# Patient Record
Sex: Male | Born: 1965 | Race: White | Hispanic: No | Marital: Single | Smoking: Never smoker
Health system: Southern US, Community
[De-identification: ages and names within clinical notes are randomized; demographics above are authoritative.]

## PROBLEM LIST (undated history)

## (undated) DIAGNOSIS — I10 Essential (primary) hypertension: Secondary | ICD-10-CM

## (undated) DIAGNOSIS — I5021 Acute systolic (congestive) heart failure: Secondary | ICD-10-CM

## (undated) DIAGNOSIS — F209 Schizophrenia, unspecified: Secondary | ICD-10-CM

## (undated) DIAGNOSIS — E119 Type 2 diabetes mellitus without complications: Secondary | ICD-10-CM

## (undated) DIAGNOSIS — S99919A Unspecified injury of unspecified ankle, initial encounter: Secondary | ICD-10-CM

## (undated) DIAGNOSIS — I161 Hypertensive emergency: Secondary | ICD-10-CM

## (undated) HISTORY — PX: WISDOM TOOTH EXTRACTION: SHX21

## (undated) HISTORY — PX: VARICOSE VEIN SURGERY: SHX832

---

## 2003-06-09 ENCOUNTER — Inpatient Hospital Stay (HOSPITAL_COMMUNITY): Admission: EM | Admit: 2003-06-09 | Discharge: 2003-06-17 | Payer: Self-pay | Admitting: Psychiatry

## 2003-06-21 ENCOUNTER — Emergency Department (HOSPITAL_COMMUNITY): Admission: EM | Admit: 2003-06-21 | Discharge: 2003-06-22 | Payer: Self-pay | Admitting: Emergency Medicine

## 2007-06-08 ENCOUNTER — Ambulatory Visit: Payer: Self-pay | Admitting: Internal Medicine

## 2008-11-25 ENCOUNTER — Ambulatory Visit: Payer: Self-pay | Admitting: Internal Medicine

## 2009-11-07 ENCOUNTER — Emergency Department (HOSPITAL_COMMUNITY): Admission: EM | Admit: 2009-11-07 | Discharge: 2009-11-07 | Payer: Self-pay | Admitting: Emergency Medicine

## 2011-03-11 NOTE — Discharge Summary (Signed)
NAME:  Kurt Cummings, Kurt Cummings NO.:  [id number]   MEDICAL RECORD NO.:  [id number]                   PATIENT TYPE:  IPS   LOCATION:  0404                                 FACILITY:  [facility]   PHYSICIAN:  Jeanice Lim, M.D.              DATE OF BIRTH:  1966-04-19   DATE OF ADMISSION:  06/09/2003  DATE OF DISCHARGE:  06/17/2003                                 DISCHARGE SUMMARY   IDENTIFYING DATA:  This is a 45 year old single Caucasian  male  involuntarily admitted presenting with a history of  alcohol abuse and  schizophrenia and drinking excessively on a sporadic basis and then  wandering out in the street. He had been arrested and is currently on  probation for indecent exposure. He had moved to [place] 4 months ago and  had legal problems from where he came. His father had been financially  supporting him somewhat and he did fairly well on medications when not  drinking. He denied any depression or acute dangerous ideation.   MEDICATIONS:  None.   ALLERGIES:  No known drug allergies.   PHYSICAL EXAMINATION:  Essentially within normal limits. Neurologically  nonfocal.   LABORATORY DATA:  Routine admission labs, blood sugar slightly elevated at  114. TSH 2.69, within normal limits. Urine drug screen negative.   MENTAL STATUS EXAM:  An alert, young, cooperative male. Good eye contact.  Speech somewhat occasional latency. Mood anxious. Somewhat suspicious or  guarded. Admitting to a history of paranoid ideation, but denied current  delusions. Thought process mostly goal directed. Somewhat overly detailed  and obsessive. Cognitively intact. Judgment and insight fair to poor with a  history of  poor impulse control, minimizing the role of alcohol on his  psychiatric illness.   ADMISSION DIAGNOSES:   AXIS I:  Schizophrenia, paranoid type and alcohol dependence.   AXIS II:  Deferred.   AXIS III:  None.   AXIS IV:  Moderate problems with primary  support group. Also possible  problems with the legal system and living situation.   AXIS V:  35/55.   HOSPITAL COURSE:  The patient was admitted and ordered routine  p.r.n.  medications. He underwent further monitoring. He was encouraged to  participate in individual, group and milieu therapy. The patient was  monitored for safety. He was placed on a low-dose Librium protocol for safe  detox and was started on Risperdal, targeting psychotic symptoms. Risperdal  was discontinued due to the patient's complaint of this causing him to feel  sedated and he was started on Geodon which was adjusted to minimize side-  effects. Ativan  initially which was then discontinued.   The patient worked with a Sports coach on placement options  and  disposition. The patient denied depressive symptoms, therefore Lexapro was  discontinued and Geodon was optimized. The patient reported a positive  response  to medication changes and clinical intervention.   His  condition on discharge was improved. The patient reported motivation to  be compliant with the aftercare plan. He had  no psychotic symptoms or  dangerous ideation at the time of discharge. No withdrawal symptoms and  motivation to remain abstinent and to follow up with the treatment plan.   The risk benefit ratio and alternative treatments regarding medications were  discussed and side-effects were reviewed on more than one occasion. The  patient was comfortable with the medication regimen.   DISCHARGE MEDICATIONS:  Geodon 60 mg q. a.m. and q.6 p.m. with food.   DISCHARGE INSTRUCTIONS:  He was to avoid alcohol  or drug  use. He was to go  to the emergency room if dangerous thoughts or problems with the medication  occurred.   FOLLOW UP:  He was to follow up with [facility] on  June 23, 2003, at 10 a.m.   DISCHARGE DIAGNOSES:   AXIS I:  Schizophrenia, paranoid type and alcohol dependence.   AXIS II:   Deferred.   AXIS III:  None.   AXIS IV:  Moderate problems with primary support group. Also possible  problems with the legal system and living situation.   AXIS VKallie Locks, M.D.    JEM/MEDQ  D:  07/10/2003  T:  07/12/2003  Job:  [id number]

## 2011-03-11 NOTE — H&P (Signed)
NAME:  Kurt Cummings, Kurt Cummings NO.:  [id number]   MEDICAL RECORD NO.:  [id number]                   PATIENT TYPE:  IPS   LOCATION:  0404                                 FACILITY[facility]  [facility]   PHYSICIAN:  Jeanice Lim, M.D.              DATE OF BIRTH:  10/28/1965   DATE OF ADMISSION:  06/09/2003  DATE OF DISCHARGE:                         PSYCHIATRIC ADMISSION ASSESSMENT   IDENTIFYING INFORMATION:  This is a 45 year old single white male  involuntarily admitted on June 09, 2003.   HISTORY OF PRESENT ILLNESS:  The patient presents with a history of alcohol  abuse and history of schizophrenia.  The patient reports he has been  drinking excessively but on a sporadic basis.  He states that, when he does  drink to that extent, he has some inappropriate behavior.  He has been  wandering out in the street.  The patient has been arrested and is currently  on probation for indecent exposure.  He feels that, when he goes to clubs,  he has to spend money on drinks, when he would rather not be drinking.  The  patient reports that he recently moved to [place] about four months ago  for indecent.  He thought he would be better off having a new start in a  different city.  He has been sleeping well overall.  His appetite has been  satisfactory.  When the patient was asked about hearing voices, the patient  needed clarification on what a voice would sound like.  He does denies any  depression, suicidal and homicidal thoughts and knows that he needs some  help with his drinking.   PAST PSYCHIATRIC HISTORY:  First hospitalization to [facility].  Was hospitalized in 1997 in [place], [place] and he states that  he was diagnosed, at the age of 23, with schizophrenia.   SOCIAL HISTORY:  This is a 45 year old single white male with no children.  He recently moved to [place] four months ago.  He is renting a room.  He  reports he cannot return to that room  because of his drinking behavior.  He  is on disability.  He has a court date pending on _________.   FAMILY HISTORY:  The patient denies.   ALCOHOL/DRUG HISTORY:  Nonsmoker.  He has a history of blackouts.  He  normally drinks vodka.  Denies any drug use.   PRIMARY CARE PHYSICIAN:  ________ in [place], [place].   MEDICAL PROBLEMS:  None.   MEDICATIONS:  None.   ALLERGIES:  No known allergies.   PHYSICAL EXAMINATION:  On the chart with no significant findings.  He is in  generally good health without any physical complaints.  The patient has some  bruises to his right arm.   LABORATORY DATA:  Total bilirubin 7.8.  Blood sugar mildly elevated at 114.  TSH is 2.693.  Urine drug screen was negative.   MENTAL STATUS  EXAMINATION:  Alert, young, cooperative male.  Neat with good  eye contact.  Speech with some occasional thought latency.  Mood is anxious.  The patient is anxious and somewhat suspicious.  Thought processes:  The  patient may be responding to internal stimuli due to thought latency and  questioning what hallucinations may be like.  Some positive paranoia.  No  delusions.  No flight of ideas.  Cognitive function intact.  Memory is fair.  Judgment is poor.  Insight is poor.  Poor impulse control.   DIAGNOSES:   AXIS I:  1. Paranoid schizophrenia.  2. Alcohol abuse.   AXIS II:  Deferred.   AXIS III:  None.   AXIS IV:  Problems with primary support group, lack of support, patient  bored and drinks due to boredom, housing, problems related to legal system,  other psychosocial problems.   AXIS V:  Current 35; past year 20.   PLAN:  Voluntary admission for alcohol abuse and inappropriate behavior.  Stabilize mood and thinking so patient can be safe and functional.  Will  check labs.  Will initiate Risperdal for psychosis.  Low-dose Librium to  detox safely.  Will have a family session with father.  The patient is to be  medication-compliant, to remain  alcohol-free.  Caseworker to look at housing  situation.   TENTATIVE LENGTH OF STAY:  Four to six days.     Landry Corporal, N.P.                       Jeanice Lim, M.D.    JO/MEDQ  D:  06/13/2003  T:  06/14/2003  Job:  [id number]

## 2014-09-10 ENCOUNTER — Encounter (HOSPITAL_COMMUNITY): Payer: Self-pay | Admitting: Emergency Medicine

## 2014-09-10 ENCOUNTER — Emergency Department (HOSPITAL_COMMUNITY): Payer: Medicare Other

## 2014-09-10 ENCOUNTER — Inpatient Hospital Stay (HOSPITAL_COMMUNITY)
Admission: EM | Admit: 2014-09-10 | Discharge: 2014-09-16 | DRG: 286 | Disposition: A | Discharge status: Home / Self Care | Payer: Medicare Other | Attending: Internal Medicine | Admitting: Internal Medicine

## 2014-09-10 DIAGNOSIS — E786 Lipoprotein deficiency: Secondary | ICD-10-CM | POA: Diagnosis present

## 2014-09-10 DIAGNOSIS — I2510 Atherosclerotic heart disease of native coronary artery without angina pectoris: Secondary | ICD-10-CM | POA: Diagnosis present

## 2014-09-10 DIAGNOSIS — F203 Undifferentiated schizophrenia: Secondary | ICD-10-CM

## 2014-09-10 DIAGNOSIS — I5041 Acute combined systolic (congestive) and diastolic (congestive) heart failure: Principal | ICD-10-CM | POA: Insufficient documentation

## 2014-09-10 DIAGNOSIS — F209 Schizophrenia, unspecified: Secondary | ICD-10-CM | POA: Diagnosis present

## 2014-09-10 DIAGNOSIS — R0600 Dyspnea, unspecified: Secondary | ICD-10-CM | POA: Diagnosis present

## 2014-09-10 DIAGNOSIS — I5031 Acute diastolic (congestive) heart failure: Secondary | ICD-10-CM

## 2014-09-10 DIAGNOSIS — E1165 Type 2 diabetes mellitus with hyperglycemia: Secondary | ICD-10-CM | POA: Diagnosis present

## 2014-09-10 DIAGNOSIS — R9431 Abnormal electrocardiogram [ECG] [EKG]: Secondary | ICD-10-CM | POA: Diagnosis present

## 2014-09-10 DIAGNOSIS — I169 Hypertensive crisis, unspecified: Secondary | ICD-10-CM | POA: Diagnosis present

## 2014-09-10 DIAGNOSIS — R739 Hyperglycemia, unspecified: Secondary | ICD-10-CM

## 2014-09-10 DIAGNOSIS — E871 Hypo-osmolality and hyponatremia: Secondary | ICD-10-CM | POA: Diagnosis present

## 2014-09-10 DIAGNOSIS — E119 Type 2 diabetes mellitus without complications: Secondary | ICD-10-CM

## 2014-09-10 DIAGNOSIS — D72829 Elevated white blood cell count, unspecified: Secondary | ICD-10-CM | POA: Diagnosis present

## 2014-09-10 DIAGNOSIS — Z794 Long term (current) use of insulin: Secondary | ICD-10-CM | POA: Diagnosis not present

## 2014-09-10 DIAGNOSIS — I160 Hypertensive urgency: Secondary | ICD-10-CM | POA: Insufficient documentation

## 2014-09-10 DIAGNOSIS — I429 Cardiomyopathy, unspecified: Secondary | ICD-10-CM | POA: Diagnosis present

## 2014-09-10 DIAGNOSIS — I161 Hypertensive emergency: Secondary | ICD-10-CM

## 2014-09-10 DIAGNOSIS — Z7982 Long term (current) use of aspirin: Secondary | ICD-10-CM | POA: Diagnosis not present

## 2014-09-10 DIAGNOSIS — R079 Chest pain, unspecified: Secondary | ICD-10-CM | POA: Diagnosis present

## 2014-09-10 DIAGNOSIS — E876 Hypokalemia: Secondary | ICD-10-CM

## 2014-09-10 DIAGNOSIS — J9601 Acute respiratory failure with hypoxia: Secondary | ICD-10-CM | POA: Diagnosis present

## 2014-09-10 DIAGNOSIS — I5021 Acute systolic (congestive) heart failure: Secondary | ICD-10-CM

## 2014-09-10 DIAGNOSIS — I509 Heart failure, unspecified: Secondary | ICD-10-CM

## 2014-09-10 DIAGNOSIS — I10 Essential (primary) hypertension: Secondary | ICD-10-CM | POA: Diagnosis present

## 2014-09-10 DIAGNOSIS — R0602 Shortness of breath: Secondary | ICD-10-CM | POA: Diagnosis present

## 2014-09-10 DIAGNOSIS — I209 Angina pectoris, unspecified: Secondary | ICD-10-CM

## 2014-09-10 HISTORY — DX: Type 2 diabetes mellitus without complications: E11.9

## 2014-09-10 HISTORY — DX: Hypertensive emergency: I16.1

## 2014-09-10 HISTORY — DX: Schizophrenia, unspecified: F20.9

## 2014-09-10 HISTORY — DX: Essential (primary) hypertension: I10

## 2014-09-10 HISTORY — DX: Unspecified injury of unspecified ankle, initial encounter: S99.919A

## 2014-09-10 HISTORY — DX: Acute systolic (congestive) heart failure: I50.21

## 2014-09-10 LAB — BASIC METABOLIC PANEL
ANION GAP: 16 — AB (ref 5–15)
BUN: 16 mg/dL (ref 6–23)
CHLORIDE: 98 meq/L (ref 96–112)
CO2: 20 meq/L (ref 19–32)
Calcium: 9.1 mg/dL (ref 8.4–10.5)
Creatinine, Ser: 0.63 mg/dL (ref 0.50–1.35)
GFR calc Af Amer: >90 mL/min (ref >90)
GFR calc non Af Amer: >90 mL/min (ref >90)
Glucose, Bld: 251 mg/dL — ABNORMAL HIGH (ref 70–99)
Potassium: 3.8 mEq/L (ref 3.7–5.3)
Sodium: 134 mEq/L — ABNORMAL LOW (ref 137–147)

## 2014-09-10 LAB — CBC
HEMATOCRIT: 39 % (ref 39.0–52.0)
HEMOGLOBIN: 14.1 g/dL (ref 13.0–17.0)
MCH: 28 pg (ref 26.0–34.0)
MCHC: 36.2 g/dL — ABNORMAL HIGH (ref 30.0–36.0)
MCV: 77.4 fL — AB (ref 78.0–100.0)
Platelets: 204 10*3/uL (ref 150–400)
RBC: 5.04 MIL/uL (ref 4.22–5.81)
RDW: 13.5 % (ref 11.5–15.5)
WBC: 14.8 10*3/uL — ABNORMAL HIGH (ref 4.0–10.5)

## 2014-09-10 LAB — I-STAT TROPONIN, ED: Troponin i, poc: 0.03 ng/mL (ref 0.00–0.08)

## 2014-09-10 LAB — PRO B NATRIURETIC PEPTIDE: PRO B NATRI PEPTIDE: 957.1 pg/mL — AB (ref 0–125)

## 2014-09-10 IMAGING — CR DG CHEST 2V
2 series · 2 of 2 positions shown · non-contrast
Comparison: None.

CLINICAL DATA: Cough for 1 week. Shortness of breath at night when
lying down.

EXAM:
CHEST  2 VIEW

[w chest pa]
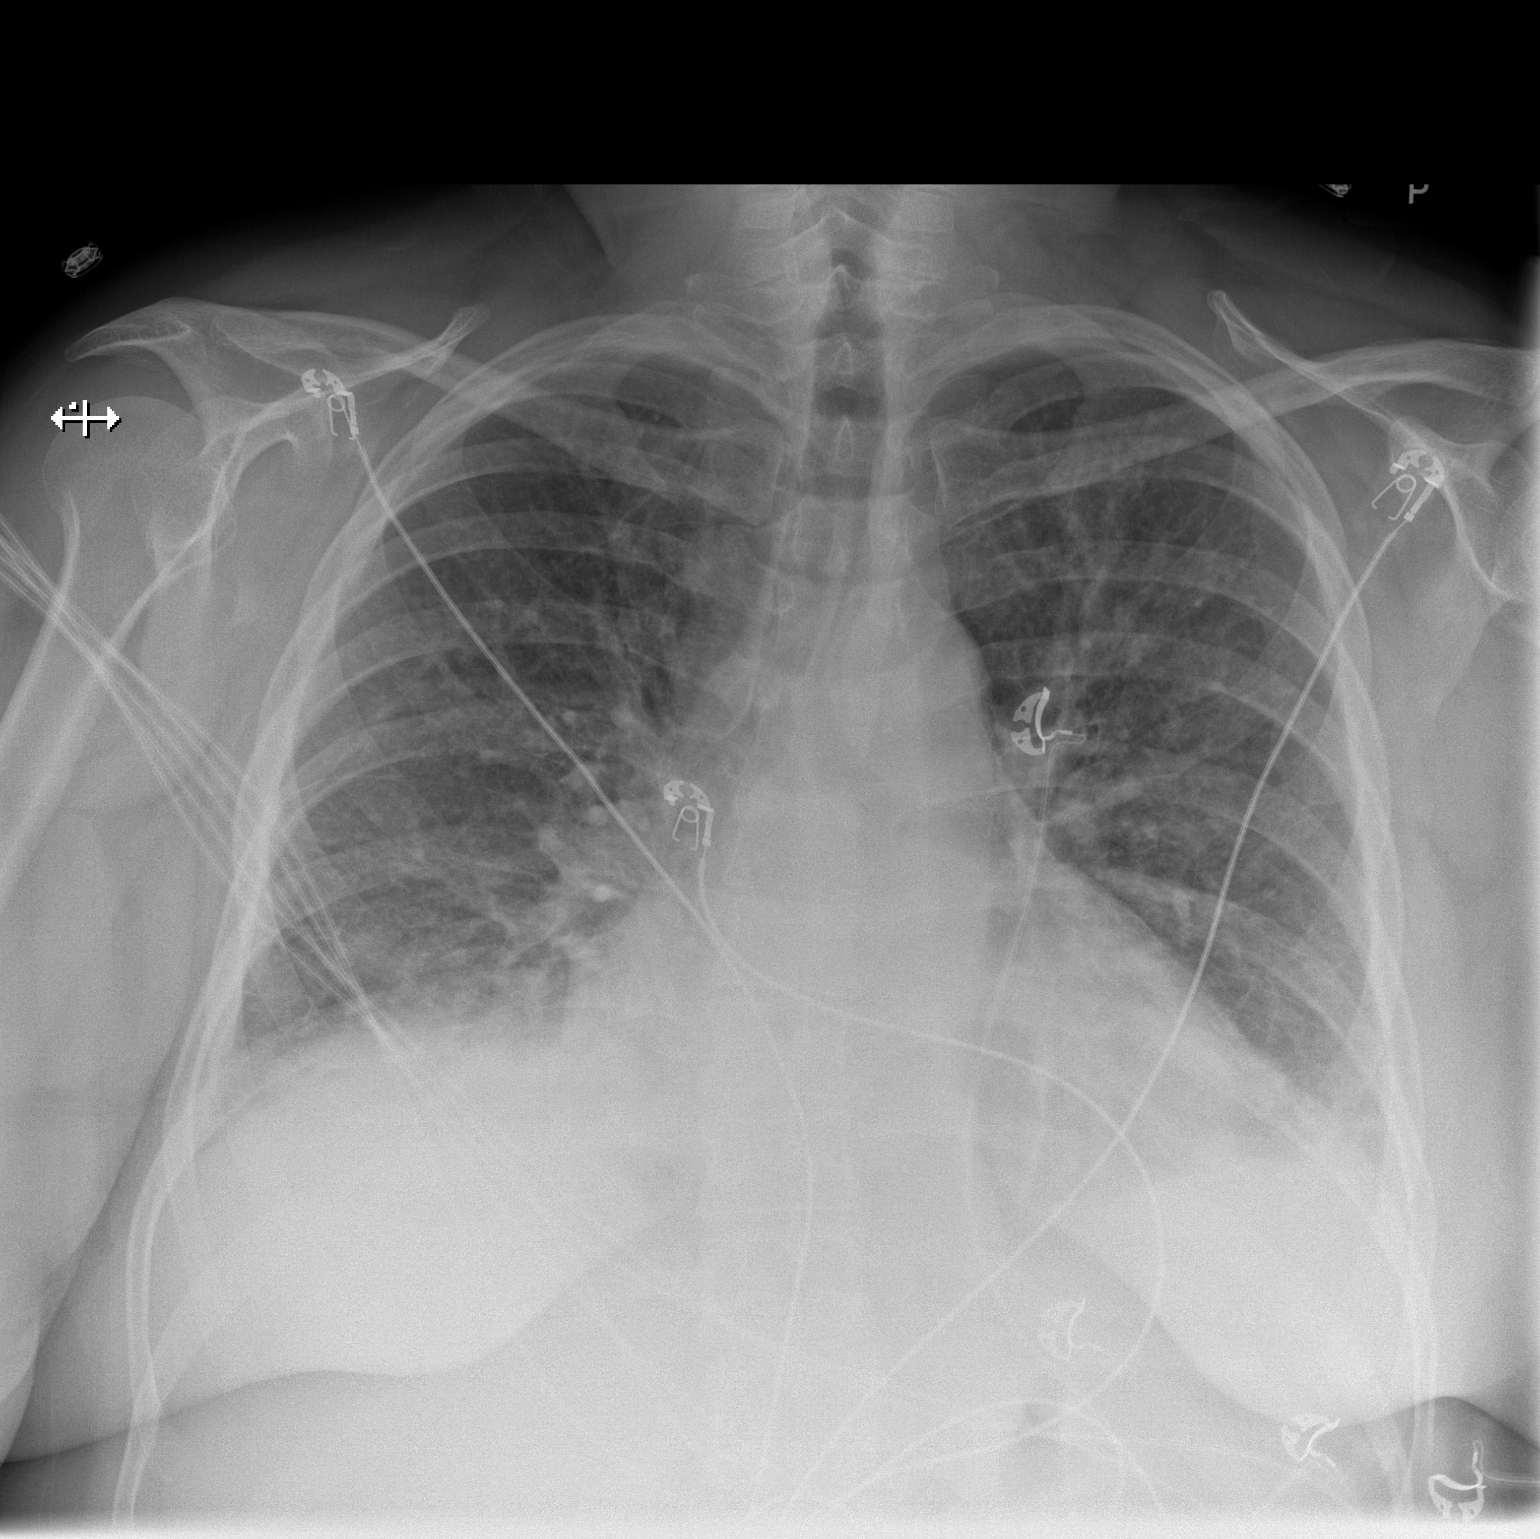

[w chest lat]
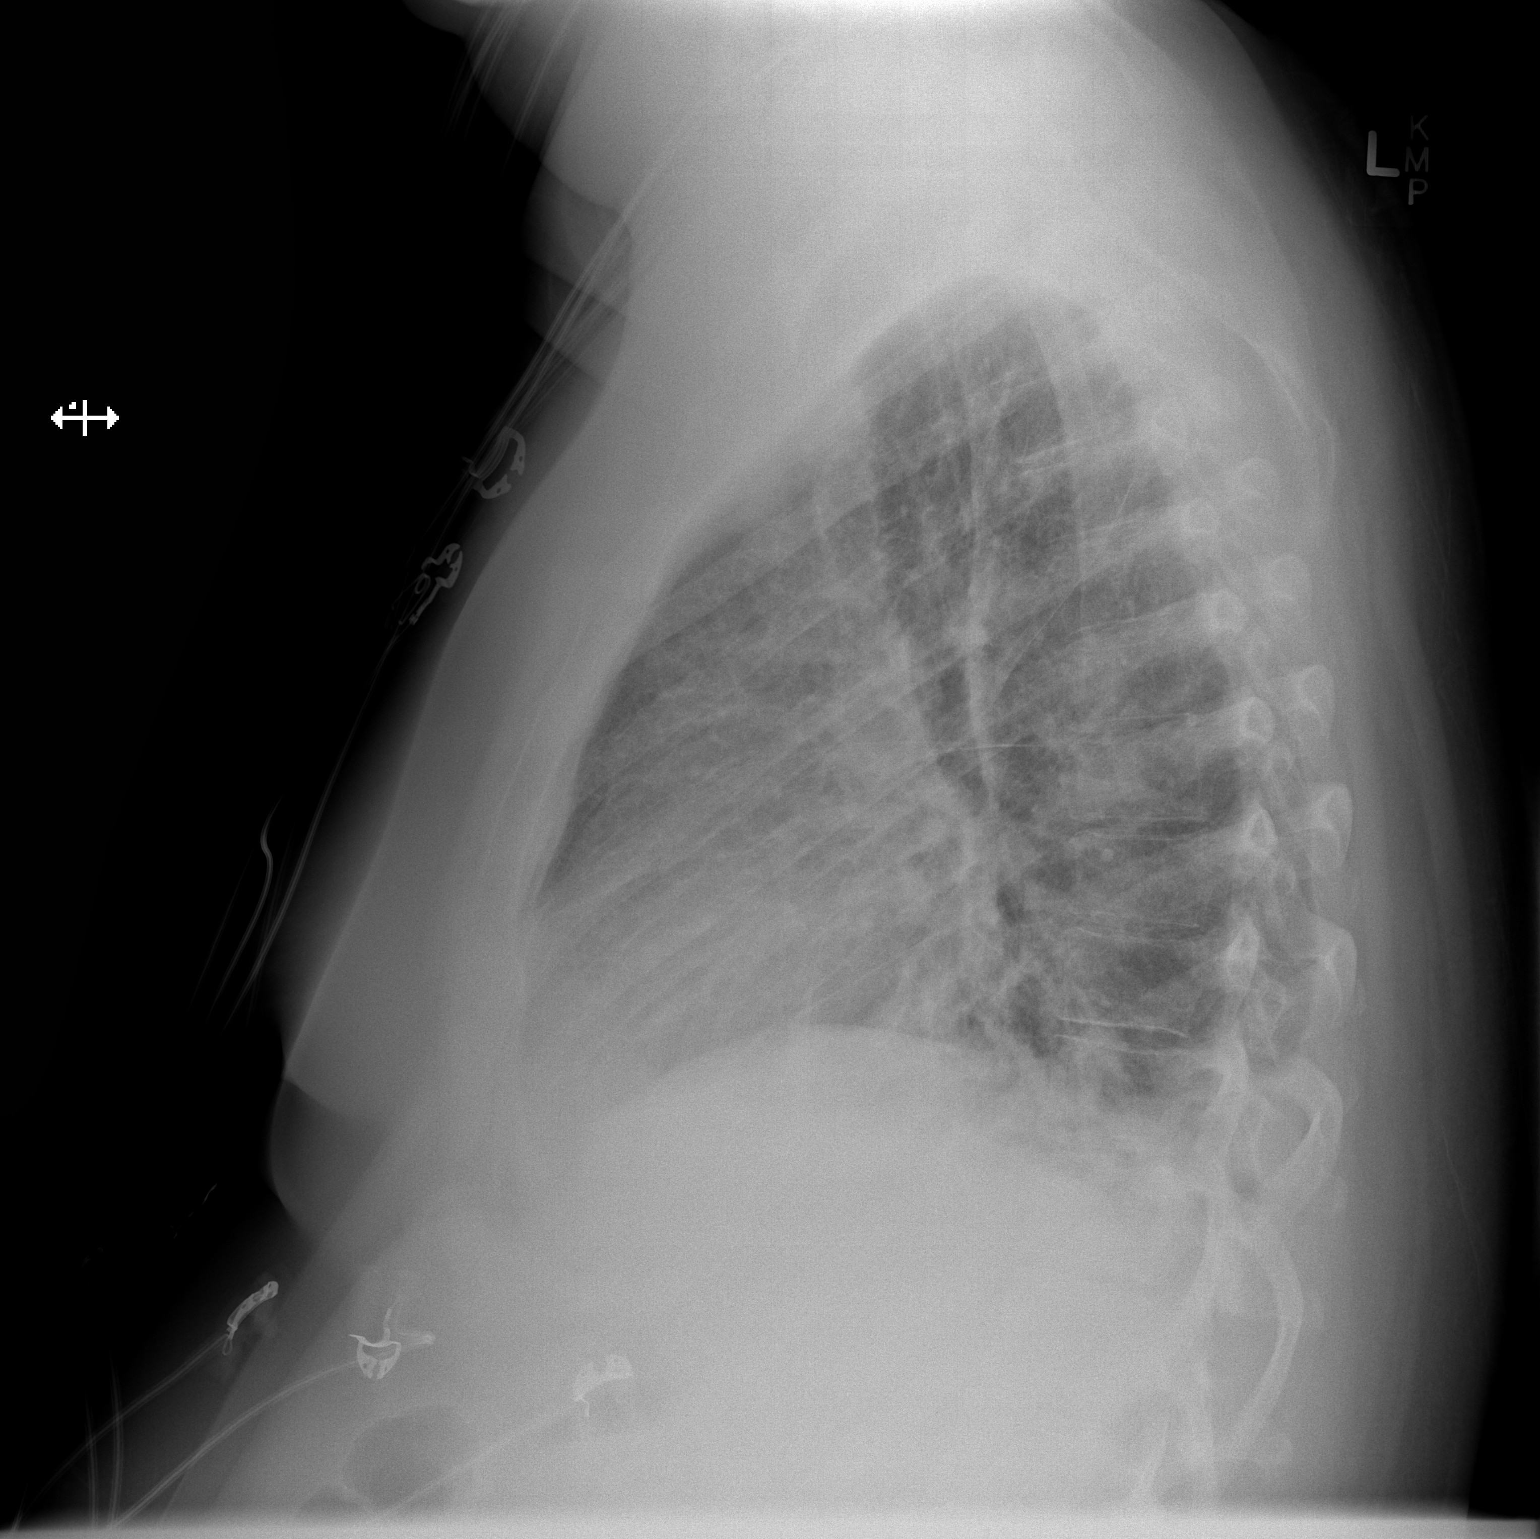

[2 of 2 positions shown; findings below may reference images not displayed]

FINDINGS: The cardiac silhouette appears mildly enlarged. Mediastinal
silhouette is nonsuspicious. Central pulmonary vascular congestion
and interstitial prominence. Small pleural effusion, likely on the
LEFT and seen only on lateral. Bibasilar strandy densities. Mildly
elevated RIGHT hemidiaphragm. No pneumothorax. Soft tissue planes
and included osseous structures unremarkable.
IMPRESSION: Mild cardiomegaly, interstitial prominence likely represents
pulmonary edema with small pleural effusion. Bibasilar atelectasis
versus confluent edema, less likely pneumonia.

  By: RTOYOTA

## 2014-09-10 MED ORDER — NITROGLYCERIN IN D5W 200-5 MCG/ML-% IV SOLN
0.0000 ug/min | Freq: Once | INTRAVENOUS | Status: AC
Start: 2014-09-10 — End: 2014-09-10
  Administered 2014-09-10: 5 ug/min via INTRAVENOUS
  Filled 2014-09-10: qty 250

## 2014-09-10 NOTE — ED Notes (Signed)
Patient transported to X-ray 

## 2014-09-10 NOTE — ED Notes (Signed)
Pt stated that he was not able to urinate.   Informed RN.

## 2014-09-10 NOTE — ED Notes (Addendum)
Pt presents with c/o cough x 1 week, with increasing orthopnea. Pt states he feels gurgling with exhale while lying in bed, pt appears very anxious. Pt states he took half of a caffeine pill and drank 2 pots of coffee today.

## 2014-09-10 NOTE — H&P (Signed)
PCP: none  Chief Complaint:  dyspnea  HPI: Kurt Cummings is a 48 y.o. male   has a past medical history of Ankle injury and Schizophrenia.   Presented with  Patient came in  With shortness of breath for the past 1 week. He has hx of Schizophrenia which is untreated. He is not followed by any one as an outpatient. He reports trouble laying down flat.  CXR showed mild cardiomegaly and pulmonary edema. He denies any hx of CHF. He does report hx of HTN but was not taking anything for it. Patient states he has never tested for DM. Reports some frequent urination.  Reports poor sleep due to difficulty breathing. In ER he was noted to be hypertensive up to 220/130. 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 Patient have not been sleeping well and have been taking caffeine pills and drinking large amount of coffee trying to stay awake. He reports reccurent headaches and have been taking daily aspirin.  He denies any chest pain but reports some chest heaviness.   Hospitalist was called for admission for  Dyspnea and hypertensive urgency.  Review of Systems:    Pertinent positives include:  shortness of breath at rest.   dyspnea on exertion, non-productive cough,  Constitutional:  No weight loss, night sweats, Fevers, chills, fatigue, weight loss  HEENT:  No headaches, Difficulty swallowing,Tooth/dental problems,Sore throat,  No sneezing, itching, ear ache, nasal congestion, post nasal drip,  Cardio-vascular:  No chest pain, Orthopnea, PND, anasarca, dizziness, palpitations. no Bilateral lower extremity swelling  GI:  No heartburn, indigestion, abdominal pain, nausea, vomiting, diarrhea, change in bowel habits, loss of appetite, melena, blood in stool, hematemesis Resp:    No excess mucus, no productive cough, No  No coughing up of blood. No change in color of mucus. No wheezing. Skin:  no rash or lesions. No jaundice GU:  no  dysuria, change in color of urine, no urgency or frequency. No straining to urinate.  No flank pain.  Musculoskeletal:  No joint pain or no joint swelling. No decreased range of motion. No back pain.  Psych:  No change in mood or affect. No depression or anxiety. No memory loss.  Neuro: no localizing neurological complaints, no tingling, no weakness, no double vision, no gait abnormality, no slurred speech, no confusion  Otherwise ROS are negative except for above, 10 systems were reviewed  Past Medical History: Past Medical History  Diagnosis Date  . Ankle injury   . Schizophrenia    Past Surgical History  Procedure Laterality Date  . Wisdom tooth extraction    . Varicose vein surgery      Medications: Prior to Admission medications   Medication Sig Start Date End Date Taking? Authorizing Provider  acetaminophen (TYLENOL) 650 MG CR tablet Take 650 mg by mouth every 8 (eight) hours as needed for pain.   Yes Historical Provider, MD  aspirin 325 MG tablet Take 325 mg by mouth once.   Yes Historical Provider, MD  caffeine 200 MG TABS tablet Take 100 mg by mouth daily as needed (to help stay awake).   Yes Historical Provider, MD  Multiple Vitamin (MULTIVITAMIN WITH MINERALS) TABS tablet Take 1 tablet by mouth daily.   Yes Historical Provider, MD    Allergies:  No Known Allergies  Social History:  Ambulatory  independently   Lives at home alone,      reports that he has never smoked.  He does not have any smokeless tobacco history on file. He reports that he does not drink alcohol or use illicit drugs.    Family History: family history is not on file.    Physical Exam: Patient Vitals for the past 24 hrs:  BP Temp Temp src Pulse Resp SpO2 Height Weight  09/10/14 2230 (!) 187/115 mmHg - - 113 26 96 % - -  09/10/14 2215 (!) 194/115 mmHg - - 107 21 91 % - -  09/10/14 2200 (!) 181/101 mmHg - - 109 25 93 % - -  09/10/14 2145 (!) 189/111 mmHg - - 110 26 95 % - -  09/10/14 2130  (!) 150/114 mmHg - - 110 (!) 27 95 % - -  09/10/14 2100 (!) 220/130 mmHg - - 107 25 94 % - -  09/10/14 2045 (!) 212/135 mmHg - - 115 26 93 % - -  09/10/14 2013 (!) 212/125 mmHg 98.6 F (37 C) Oral 113 20 99 % 5\' 9"  (1.753 m) 117.935 kg (260 lb)    1. General:  in No Acute distress 2. Psychological: Alert and Oriented, no active hallucinations 3. Head/ENT:   Moist  Mucous Membranes                          Head Non traumatic, neck supple                          Normal  Dentition 4. SKIN: normal  Skin turgor,  Skin clean Dry and intact no rash 5. Heart: Regular rate and rhythm no Murmur, Rub or gallop 6. Lungs:  no wheezes mild crackles  At the bases 7. Abdomen: Soft, non-tender, Non distended 8. Lower extremities: no clubbing, cyanosis, 1+ edema in Left leg 9. Neurologically Grossly intact, moving all 4 extremities equally 10. MSK: Normal range of motion  body mass index is 38.38 kg/(m^2).   Labs on Admission:   Results for orders placed or performed during the hospital encounter of 09/10/14 (from the past 24 hour(s))  BNP (order ONLY if patient complains of dyspnea/SOB AND you have documented it for THIS visit)     Status: Abnormal   Collection Time: 09/10/14  8:47 PM  Result Value Ref Range   Pro B Natriuretic peptide (BNP) 957.1 (H) 0 - 125 pg/mL  Basic metabolic panel     Status: Abnormal   Collection Time: 09/10/14  8:48 PM  Result Value Ref Range   Sodium 134 (L) 137 - 147 mEq/L   Potassium 3.8 3.7 - 5.3 mEq/L   Chloride 98 96 - 112 mEq/L   CO2 20 19 - 32 mEq/L   Glucose, Bld 251 (H) 70 - 99 mg/dL   BUN 16 6 - 23 mg/dL   Creatinine, Ser 1.61 0.50 - 1.35 mg/dL   Calcium 9.1 8.4 - 09.6 mg/dL   GFR calc non Af Amer >90 >90 mL/min   GFR calc Af Amer >90 >90 mL/min   Anion gap 16 (H) 5 - 15  CBC     Status: Abnormal   Collection Time: 09/10/14  8:48 PM  Result Value Ref Range   WBC 14.8 (H) 4.0 - 10.5 K/uL   RBC 5.04 4.22 - 5.81 MIL/uL   Hemoglobin 14.1 13.0 - 17.0  g/dL   HCT 04.5 40.9 - 81.1 %   MCV 77.4 (L) 78.0 - 100.0 fL   MCH 28.0 26.0 - 34.0  pg   MCHC 36.2 (H) 30.0 - 36.0 g/dL   RDW 46.913.5 62.911.5 - 52.815.5 %   Platelets 204 150 - 400 K/uL  I-stat troponin, ED (not [facility])     Status: None   Collection Time: 09/10/14  9:03 PM  Result Value Ref Range   Troponin i, poc 0.03 0.00 - 0.08 ng/mL   Comment 3            UA currently pending  No results found for: HGBA1C  Estimated Creatinine Clearance: 141.6 mL/min (by C-G formula based on Cr of 0.63).  BNP (last 3 results)  Recent Labs  09/10/14 2047  PROBNP 957.1*    Other results:  I have pearsonaly reviewed this: ECG REPORT  Rate:117  Rhythm: ST ST&T Change: no ischemia Prolonged QT 507   Filed Weights   09/10/14 2013  Weight: 117.935 kg (260 lb)    Cultures: No results found for: SDES, SPECREQUEST, CULT, REPTSTATUS   Radiological Exams on Admission: Dg Chest 2 View  09/10/2014   CLINICAL DATA:  Cough for 1 week. Shortness of breath at night when lying down.  EXAM: CHEST  2 VIEW  COMPARISON:  None.  FINDINGS: The cardiac silhouette appears mildly enlarged. Mediastinal silhouette is nonsuspicious. Central pulmonary vascular congestion and interstitial prominence. Small pleural effusion, likely on the LEFT and seen only on lateral. Bibasilar strandy densities. Mildly elevated RIGHT hemidiaphragm. No pneumothorax. Soft tissue planes and included osseous structures unremarkable.  IMPRESSION: Mild cardiomegaly, interstitial prominence likely represents pulmonary edema with small pleural effusion. Bibasilar atelectasis versus confluent edema, less likely pneumonia.   Electronically Signed   By: Awilda Metroourtnay  Bloomer   On: 09/10/2014 21:38    Chart has been reviewed  Assessment/Plan  48 year old gentleman history of schizophrenia and medical  Noncompliance. Here with hypertension, evidence of pulmonary edema a new diagnosis of diabetes  Present on Admission:  . Dyspnea - in the setting  of pulmonary edema will treat for possible CHF. Will obtain echogram. Give Lasix. Cycle cardiac enzymes and monitor on telemetry. We'll check d-dimer and given edema of the left leg check Dopplers. Patient reports a edema is chronic which is reassuring. Marland Kitchen. Hypertensive urgency - admit to step down on nitroglycerin drip. Given likely CHF will initiate ACE inhibitor, obtain echogram cycle cardiac enzymes . Prolonged QT interval - monitor on telemetry avoid QT prolonging medications . Schizophrenia - patient has no psychiatric follow-up. It will be important to establish psychiatric care if he is interested . Leukocytosis - etiology unclear Will obtain UA at this point no evidence of infection. Patient did report some cough but chest x-ray did not show any evidence of pneumonia. Presumed CHF exacerbation. - Will initiate new CHF workup including an echogram cycle cardiac enzymes obtain TSH obtain HIV. He'll likely benefit from cardiology follow up New diagnosis of diabetes - Will initiate sliding scale. At discharge patient would likely benefit from metformin. He reports already improving his diet. We'll write for diabetes careconsultant   Prophylaxis:  Lovenox,  CODE STATUS:  FULL CODE    Other plan as per orders.  I have spent a total of 55 min on this admission  Dmarius Reeder 09/10/2014, 10:55 PM  [facility]  Pager [phone]   after 2 AM please page floor coverage PA If 7AM-7PM, please contact the day team taking care of the patient  [email or web address]  Password [email or web address]

## 2014-09-10 NOTE — ED Provider Notes (Signed)
CSN: [id number]     Arrival date & time 09/10/14  1923 History   First MD Initiated Contact with Patient 09/10/14 2037     Chief Complaint  Patient presents with  . Shortness of Breath     (Consider location/radiation/quality/duration/timing/severity/associated sxs/prior Treatment) HPI  Patient reports for the past week he's had a dry cough without fever. States he has been waking up every morning with a diffuse mild headache that is non-throbbing. He states he takes a couple aspirin and some acetaminophen and the headache goes away. He states that at night he is having PND and he feels a gurgling when he breathes out in his throat. He denies feeling rattling in his chest but he feels like his chest is congested. He states this afternoon his chest felt heavy. He states he is having dyspnea on exertion and today he felt like he was going to die. Patient reports last time he had his blood pressure checked was over a year ago and he was told it was high however he did not follow-up with a primary care doctor. He also reports he was taking a lot of ibuprofen daily for several years due to a injury to his right ankle tendon however he stopped taking the ibuprofen and has been taking acetaminophen for couple months. He also reports he's been getting nosebleeds that have improved since he stopped taking the ibuprofen. At the time of my exam patient seemed anxious and he was tachycardic with the elevated blood pressure and he stated that he felt fine now but when he walked from the car to the ED he was stuggling to breathe. PCP none  Past Medical History  Diagnosis Date  . Ankle injury   . Schizophrenia    Past Surgical History  Procedure Laterality Date  . Wisdom tooth extraction    . Varicose vein surgery     No family history on file. History  Substance Use Topics  . Smoking status: Never Smoker   . Smokeless tobacco: Not on file  . Alcohol Use: No  lives alone On disability for  schizophrenia  Review of Systems  All other systems reviewed and are negative.     Allergies  Review of patient's allergies indicates no known allergies.  Home Medications   Prior to Admission medications   Medication Sig Start Date End Date Taking? Authorizing Provider  acetaminophen (TYLENOL) 650 MG CR tablet Take 650 mg by mouth every 8 (eight) hours as needed for pain.   Yes Historical Provider, MD  aspirin 325 MG tablet Take 325 mg by mouth once.   Yes Historical Provider, MD  caffeine 200 MG TABS tablet Take 100 mg by mouth daily as needed (to help stay awake).   Yes Historical Provider, MD  Multiple Vitamin (MULTIVITAMIN WITH MINERALS) TABS tablet Take 1 tablet by mouth daily.   Yes Historical Provider, MD   BP 220/130 mmHg  Pulse 107  Temp(Src) 98.6 F (37 C) (Oral)  Resp 26  Ht 5\' 9"  (1.753 m)  Wt 260 lb (117.935 kg)  BMI 38.38 kg/m2  SpO2 96%  Vital signs normal except for hypertension and tachycardia  Physical Exam  Constitutional: He is oriented to person, place, and time. He appears well-developed and well-nourished.  Non-toxic appearance. He does not appear ill. No distress.  obese  HENT:  Head: Normocephalic and atraumatic.  Right Ear: External ear normal.  Left Ear: External ear normal.  Nose: Nose normal. No mucosal edema or rhinorrhea.  Mouth/Throat:  Oropharynx is clear and moist and mucous membranes are normal. No dental abscesses or uvula swelling.  Eyes: Conjunctivae and EOM are normal. Pupils are equal, round, and reactive to light.  Neck: Normal range of motion and full passive range of motion without pain. Neck supple.  Cardiovascular: Normal rate, regular rhythm and normal heart sounds.  Exam reveals no gallop and no friction rub.   No murmur heard. Pulmonary/Chest: Effort normal. Tachypnea noted. No respiratory distress. He has decreased breath sounds. He has no wheezes. He has no rhonchi. He has no rales. He exhibits no tenderness and no  crepitus.  Abdominal: Soft. Normal appearance and bowel sounds are normal. He exhibits no distension. There is no tenderness. There is no rebound and no guarding.  Musculoskeletal: Normal range of motion. He exhibits edema. He exhibits no tenderness.  Moves all extremities well. Has mild swelling of both lower extremities but the left is worse than the right.  Neurological: He is alert and oriented to person, place, and time. He has normal strength. No cranial nerve deficit.  Skin: Skin is warm, dry and intact. No rash noted. No erythema. No pallor.  Face flushed  Psychiatric: His mood appears anxious. His speech is rapid and/or pressured. He is hyperactive.  Nursing note and vitals reviewed.   ED Course  Procedures (including critical care time)  Medications  nitroGLYCERIN 50 mg in dextrose 5 % 250 mL (0.2 mg/mL) infusion (20 mcg/min Intravenous Rate/Dose Change 09/10/14 2223)    Patient  was started on a nitroglycerin drip which was titrated to control his blood pressure. His blood pressure improved from 220/130 to 187/115.  Patient was given his test results. He is agreeable to be admitted.  22:32 Dr Adela Glimpseoutova, admit to step down  Labs Review Results for orders placed or performed during the hospital encounter of 09/10/14  BNP (order ONLY if patient complains of dyspnea/SOB AND you have documented it for THIS visit)  Result Value Ref Range   Pro B Natriuretic peptide (BNP) 957.1 (H) 0 - 125 pg/mL  Basic metabolic panel  Result Value Ref Range   Sodium 134 (L) 137 - 147 mEq/L   Potassium 3.8 3.7 - 5.3 mEq/L   Chloride 98 96 - 112 mEq/L   CO2 20 19 - 32 mEq/L   Glucose, Bld 251 (H) 70 - 99 mg/dL   BUN 16 6 - 23 mg/dL   Creatinine, Ser 1.610.63 0.50 - 1.35 mg/dL   Calcium 9.1 8.4 - 09.610.5 mg/dL   GFR calc non Af Amer >90 >90 mL/min   GFR calc Af Amer >90 >90 mL/min   Anion gap 16 (H) 5 - 15  CBC  Result Value Ref Range   WBC 14.8 (H) 4.0 - 10.5 K/uL   RBC 5.04 4.22 - 5.81 MIL/uL    Hemoglobin 14.1 13.0 - 17.0 g/dL   HCT 04.539.0 40.939.0 - 81.152.0 %   MCV 77.4 (L) 78.0 - 100.0 fL   MCH 28.0 26.0 - 34.0 pg   MCHC 36.2 (H) 30.0 - 36.0 g/dL   RDW 91.413.5 78.211.5 - 95.615.5 %   Platelets 204 150 - 400 K/uL  I-stat troponin, ED ([facility])  Result Value Ref Range   Troponin i, poc 0.03 0.00 - 0.08 ng/mL   Comment 3           Laboratory interpretation all normal except leukocytosis, hyperglycemia, elevated BNP   Imaging Review Dg Chest 2 View  09/10/2014   CLINICAL DATA:  Cough  for 1 week. Shortness of breath at night when lying down.  EXAM: CHEST  2 VIEW  COMPARISON:  None.  FINDINGS: The cardiac silhouette appears mildly enlarged. Mediastinal silhouette is nonsuspicious. Central pulmonary vascular congestion and interstitial prominence. Small pleural effusion, likely on the LEFT and seen only on lateral. Bibasilar strandy densities. Mildly elevated RIGHT hemidiaphragm. No pneumothorax. Soft tissue planes and included osseous structures unremarkable.  IMPRESSION: Mild cardiomegaly, interstitial prominence likely represents pulmonary edema with small pleural effusion. Bibasilar atelectasis versus confluent edema, less likely pneumonia.   Electronically Signed   By: Awilda Metro   On: 09/10/2014 21:38     EKG Interpretation   Date/Time:  Wednesday September 10 2014 20:32:32 EST Ventricular Rate:  117 PR Interval:  161 QRS Duration: 97 QT Interval:  365 QTC Calculation: 509 R Axis:   63 Text Interpretation:  Sinus tachycardia Anteroseptal infarct, old  Prolonged QT interval No old tracing to compare Confirmed by Lacinda Curvin  MD-I,  Janiyah Beery ([id number]) on 09/10/2014 9:14:58 PM      MDM  Patient presents with untreated hypertension and is now in hypertensive crisis with congestive heart failure. He also is probably a untreated diabetic with a CBG of 250. Patient is being admitted for treatment of his hypertension and his diabetes.   Final diagnoses:  Hypertensive urgency  Acute systolic  congestive heart failure  Hyperglycemia    Plan admission   Devoria Albe, MD, FACEP   CRITICAL CARE Performed by: Devoria Albe L Total critical care time: 38 min Critical care time was exclusive of separately billable procedures and treating other patients. Critical care was necessary to treat or prevent imminent or life-threatening deterioration. Critical care was time spent personally by me on the following activities: development of treatment plan with patient and/or surrogate as well as nursing, discussions with consultants, evaluation of patient's response to treatment, examination of patient, obtaining history from patient or surrogate, ordering and performing treatments and interventions, ordering and review of laboratory studies, ordering and review of radiographic studies, pulse oximetry and re-evaluation of patient's condition.     Ward Givens, MD 09/10/14 [phone]

## 2014-09-10 NOTE — ED Notes (Signed)
Pt states has had a cough at least 1 week, states its dry, no mucus, pt states feels short of breath at night when laying down, states in his throat he hears a gurgle sound, pt states he has on/off periods of the shortness of breath.

## 2014-09-11 ENCOUNTER — Encounter (HOSPITAL_COMMUNITY): Payer: Self-pay | Admitting: Internal Medicine

## 2014-09-11 ENCOUNTER — Inpatient Hospital Stay (HOSPITAL_COMMUNITY): Payer: Medicare Other

## 2014-09-11 DIAGNOSIS — J810 Acute pulmonary edema: Secondary | ICD-10-CM

## 2014-09-11 DIAGNOSIS — I5031 Acute diastolic (congestive) heart failure: Secondary | ICD-10-CM

## 2014-09-11 DIAGNOSIS — D72829 Elevated white blood cell count, unspecified: Secondary | ICD-10-CM

## 2014-09-11 DIAGNOSIS — R0602 Shortness of breath: Secondary | ICD-10-CM

## 2014-09-11 DIAGNOSIS — I517 Cardiomegaly: Secondary | ICD-10-CM

## 2014-09-11 LAB — GLUCOSE, CAPILLARY
Glucose-Capillary: 184 mg/dL — ABNORMAL HIGH (ref 70–99)
Glucose-Capillary: 186 mg/dL — ABNORMAL HIGH (ref 70–99)
Glucose-Capillary: 248 mg/dL — ABNORMAL HIGH (ref 70–99)
Glucose-Capillary: 193 mg/dL — ABNORMAL HIGH (ref 70–99)
Glucose-Capillary: 223 mg/dL — ABNORMAL HIGH (ref 70–99)

## 2014-09-11 LAB — CREATININE, SERUM
CREATININE: 0.61 mg/dL (ref 0.50–1.35)
GFR calc Af Amer: >90 mL/min (ref >90)

## 2014-09-11 LAB — URINALYSIS, ROUTINE W REFLEX MICROSCOPIC
BILIRUBIN URINE: NEGATIVE
GLUCOSE, UA: 500 mg/dL — AB
HGB URINE DIPSTICK: NEGATIVE
Ketones, ur: NEGATIVE mg/dL
Leukocytes, UA: NEGATIVE
Nitrite: NEGATIVE
PROTEIN: NEGATIVE mg/dL
Specific Gravity, Urine: 1.014 (ref 1.005–1.030)
Urobilinogen, UA: 0.2 mg/dL (ref 0.0–1.0)
pH: 6 (ref 5.0–8.0)

## 2014-09-11 LAB — COMPREHENSIVE METABOLIC PANEL
ALT: 68 U/L — AB (ref 0–53)
AST: 32 U/L (ref 0–37)
Albumin: 3.5 g/dL (ref 3.5–5.2)
Alkaline Phosphatase: 87 U/L (ref 39–117)
Anion gap: 13 (ref 5–15)
BILIRUBIN TOTAL: 1 mg/dL (ref 0.3–1.2)
BUN: 14 mg/dL (ref 6–23)
CO2: 24 meq/L (ref 19–32)
CREATININE: 0.65 mg/dL (ref 0.50–1.35)
Calcium: 8.7 mg/dL (ref 8.4–10.5)
Chloride: 94 mEq/L — ABNORMAL LOW (ref 96–112)
GFR calc Af Amer: >90 mL/min (ref >90)
GLUCOSE: 269 mg/dL — AB (ref 70–99)
Potassium: 3.2 mEq/L — ABNORMAL LOW (ref 3.7–5.3)
Sodium: 131 mEq/L — ABNORMAL LOW (ref 137–147)
Total Protein: 6.5 g/dL (ref 6.0–8.3)

## 2014-09-11 LAB — CBC
HCT: 36.8 % — ABNORMAL LOW (ref 39.0–52.0)
HCT: 35.2 % — ABNORMAL LOW (ref 39.0–52.0)
Hemoglobin: 12.8 g/dL — ABNORMAL LOW (ref 13.0–17.0)
Hemoglobin: 12.2 g/dL — ABNORMAL LOW (ref 13.0–17.0)
MCH: 27.1 pg (ref 26.0–34.0)
MCH: 27.1 pg (ref 26.0–34.0)
MCHC: 34.7 g/dL (ref 30.0–36.0)
MCHC: 34.8 g/dL (ref 30.0–36.0)
MCV: 77.8 fL — ABNORMAL LOW (ref 78.0–100.0)
MCV: 78.2 fL (ref 78.0–100.0)
PLATELETS: 212 10*3/uL (ref 150–400)
Platelets: 220 10*3/uL (ref 150–400)
RBC: 4.73 MIL/uL (ref 4.22–5.81)
RBC: 4.5 MIL/uL (ref 4.22–5.81)
RDW: 13.5 % (ref 11.5–15.5)
RDW: 13.6 % (ref 11.5–15.5)
WBC: 12.3 10*3/uL — ABNORMAL HIGH (ref 4.0–10.5)
WBC: 9.7 10*3/uL (ref 4.0–10.5)

## 2014-09-11 LAB — MAGNESIUM: Magnesium: 1.7 mg/dL (ref 1.5–2.5)

## 2014-09-11 LAB — PHOSPHORUS: Phosphorus: 2.8 mg/dL (ref 2.3–4.6)

## 2014-09-11 LAB — TROPONIN I
Troponin I: <0.3 ng/mL (ref <0.30)
Troponin I: <0.3 ng/mL (ref <0.30)
Troponin I: <0.3 ng/mL (ref <0.30)

## 2014-09-11 LAB — RAPID URINE DRUG SCREEN, HOSP PERFORMED
AMPHETAMINES: NOT DETECTED
BARBITURATES: NOT DETECTED
Benzodiazepines: NOT DETECTED
Cocaine: NOT DETECTED
OPIATES: NOT DETECTED
TETRAHYDROCANNABINOL: NOT DETECTED

## 2014-09-11 LAB — TSH: TSH: 3.59 u[IU]/mL (ref 0.350–4.500)

## 2014-09-11 LAB — HIV ANTIBODY (ROUTINE TESTING W REFLEX): HIV 1&2 Ab, 4th Generation: NONREACTIVE

## 2014-09-11 LAB — HEMOGLOBIN A1C
Hgb A1c MFr Bld: 10 % — ABNORMAL HIGH (ref <5.7)
Mean Plasma Glucose: 240 mg/dL — ABNORMAL HIGH (ref <117)

## 2014-09-11 LAB — D-DIMER, QUANTITATIVE (NOT AT ARMC): D-Dimer, Quant: 0.64 ug/mL-FEU — ABNORMAL HIGH (ref 0.00–0.48)

## 2014-09-11 LAB — MRSA PCR SCREENING: MRSA by PCR: NEGATIVE

## 2014-09-11 MED ORDER — INSULIN ASPART 100 UNIT/ML ~~LOC~~ SOLN
0.0000 [IU] | Freq: Three times a day (TID) | SUBCUTANEOUS | Status: DC
  Administered 2014-09-11: 3 [IU] via SUBCUTANEOUS
  Administered 2014-09-11: 5 [IU] via SUBCUTANEOUS
  Administered 2014-09-11: 3 [IU] via SUBCUTANEOUS

## 2014-09-11 MED ORDER — FUROSEMIDE 10 MG/ML IJ SOLN
40.0000 mg | Freq: Two times a day (BID) | INTRAMUSCULAR | Status: DC
  Filled 2014-09-11: qty 4

## 2014-09-11 MED ORDER — HYDRALAZINE HCL 20 MG/ML IJ SOLN
5.0000 mg | Freq: Once | INTRAMUSCULAR | Status: AC
  Administered 2014-09-11: 5 mg via INTRAVENOUS
  Filled 2014-09-11: qty 1

## 2014-09-11 MED ORDER — LIVING WELL WITH DIABETES BOOK
Freq: Once | Status: AC
  Administered 2014-09-11: 13:00:00
  Filled 2014-09-11: qty 1

## 2014-09-11 MED ORDER — FUROSEMIDE 10 MG/ML IJ SOLN
40.0000 mg | Freq: Two times a day (BID) | INTRAMUSCULAR | Status: DC
  Administered 2014-09-11 – 2014-09-13 (×4): 40 mg via INTRAVENOUS
  Filled 2014-09-11 (×6): qty 4

## 2014-09-11 MED ORDER — SODIUM CHLORIDE 0.9 % IV SOLN
250.0000 mL | INTRAVENOUS | Status: DC | PRN
  Administered 2014-09-11: 250 mL via INTRAVENOUS

## 2014-09-11 MED ORDER — IPRATROPIUM-ALBUTEROL 0.5-2.5 (3) MG/3ML IN SOLN: 3.0000 mL | RESPIRATORY_TRACT | Status: DC | PRN

## 2014-09-11 MED ORDER — ENOXAPARIN SODIUM 40 MG/0.4ML ~~LOC~~ SOLN: 40.0000 mg | SUBCUTANEOUS | Status: DC

## 2014-09-11 MED ORDER — IRBESARTAN 150 MG PO TABS
150.0000 mg | ORAL_TABLET | Freq: Every day | ORAL | Status: DC
  Administered 2014-09-11 – 2014-09-12 (×2): 150 mg via ORAL
  Filled 2014-09-11 (×3): qty 1

## 2014-09-11 MED ORDER — ACETAMINOPHEN 325 MG PO TABS
650.0000 mg | ORAL_TABLET | Freq: Four times a day (QID) | ORAL | Status: DC | PRN
  Administered 2014-09-11 (×2): 650 mg via ORAL
  Filled 2014-09-11 (×2): qty 2

## 2014-09-11 MED ORDER — POTASSIUM CHLORIDE CRYS ER 20 MEQ PO TBCR
40.0000 meq | EXTENDED_RELEASE_TABLET | Freq: Once | ORAL | Status: AC
  Administered 2014-09-11: 40 meq via ORAL
  Filled 2014-09-11: qty 2

## 2014-09-11 MED ORDER — FUROSEMIDE 10 MG/ML IJ SOLN
40.0000 mg | Freq: Two times a day (BID) | INTRAMUSCULAR | Status: DC
  Administered 2014-09-11: 40 mg via INTRAVENOUS
  Filled 2014-09-11: qty 4

## 2014-09-11 MED ORDER — NITROGLYCERIN IN D5W 200-5 MCG/ML-% IV SOLN
0.0000 ug/min | INTRAVENOUS | Status: DC
  Administered 2014-09-11: 35 ug/min via INTRAVENOUS

## 2014-09-11 MED ORDER — FUROSEMIDE 10 MG/ML IJ SOLN: 40.0000 mg | Freq: Every day | INTRAMUSCULAR | Status: DC

## 2014-09-11 MED ORDER — ENOXAPARIN SODIUM 60 MG/0.6ML ~~LOC~~ SOLN
60.0000 mg | Freq: Every day | SUBCUTANEOUS | Status: DC
  Administered 2014-09-11 – 2014-09-14 (×4): 60 mg via SUBCUTANEOUS
  Filled 2014-09-11 (×5): qty 0.6

## 2014-09-11 MED ORDER — LIVING WELL WITH DIABETES BOOK
Freq: Once | Status: AC
  Administered 2014-09-11: 01:00:00
  Filled 2014-09-11: qty 1

## 2014-09-11 MED ORDER — ONDANSETRON HCL 4 MG/2ML IJ SOLN
4.0000 mg | Freq: Four times a day (QID) | INTRAMUSCULAR | Status: DC | PRN
  Administered 2014-09-11: 4 mg via INTRAVENOUS
  Filled 2014-09-11: qty 2

## 2014-09-11 MED ORDER — SODIUM CHLORIDE 0.9 % IJ SOLN
3.0000 mL | Freq: Two times a day (BID) | INTRAMUSCULAR | Status: DC
  Administered 2014-09-11 – 2014-09-14 (×7): 3 mL via INTRAVENOUS

## 2014-09-11 MED ORDER — SODIUM CHLORIDE 0.9 % IJ SOLN
3.0000 mL | Freq: Two times a day (BID) | INTRAMUSCULAR | Status: DC
Start: 2014-09-11 — End: 2014-09-11

## 2014-09-11 MED ORDER — HYDROCODONE-ACETAMINOPHEN 5-325 MG PO TABS: 1.0000 | ORAL_TABLET | ORAL | Status: DC | PRN

## 2014-09-11 MED ORDER — ASPIRIN EC 81 MG PO TBEC
81.0000 mg | DELAYED_RELEASE_TABLET | Freq: Every day | ORAL | Status: DC
  Administered 2014-09-11 – 2014-09-14 (×4): 81 mg via ORAL
  Filled 2014-09-11 (×5): qty 1

## 2014-09-11 MED ORDER — ZOLPIDEM TARTRATE 5 MG PO TABS
5.0000 mg | ORAL_TABLET | Freq: Every evening | ORAL | Status: DC | PRN
  Administered 2014-09-11 – 2014-09-15 (×6): 5 mg via ORAL
  Filled 2014-09-11 (×6): qty 1

## 2014-09-11 MED ORDER — ACETAMINOPHEN 650 MG RE SUPP: 650.0000 mg | Freq: Four times a day (QID) | RECTAL | Status: DC | PRN

## 2014-09-11 MED ORDER — ACETAMINOPHEN 325 MG PO TABS
650.0000 mg | ORAL_TABLET | ORAL | Status: DC | PRN
Start: 2014-09-11 — End: 2014-09-15
  Administered 2014-09-11 – 2014-09-14 (×8): 650 mg via ORAL
  Administered 2014-09-15: 325 mg via ORAL
  Filled 2014-09-11 (×10): qty 2

## 2014-09-11 MED ORDER — DOCUSATE SODIUM 100 MG PO CAPS
100.0000 mg | ORAL_CAPSULE | Freq: Two times a day (BID) | ORAL | Status: DC
  Administered 2014-09-11 – 2014-09-13 (×6): 100 mg via ORAL
  Filled 2014-09-11 (×14): qty 1

## 2014-09-11 MED ORDER — METOPROLOL TARTRATE 12.5 MG HALF TABLET
12.5000 mg | ORAL_TABLET | Freq: Two times a day (BID) | ORAL | Status: DC
  Administered 2014-09-11 (×2): 12.5 mg via ORAL
  Filled 2014-09-11 (×2): qty 1

## 2014-09-11 MED ORDER — IOHEXOL 350 MG/ML SOLN
100.0000 mL | Freq: Once | INTRAVENOUS | Status: AC | PRN
  Administered 2014-09-11: 100 mL via INTRAVENOUS

## 2014-09-11 MED ORDER — SODIUM CHLORIDE 0.9 % IJ SOLN: 3.0000 mL | INTRAMUSCULAR | Status: DC | PRN

## 2014-09-11 MED ORDER — LISINOPRIL 2.5 MG PO TABS
5.0000 mg | ORAL_TABLET | Freq: Every day | ORAL | Status: DC
  Administered 2014-09-11: 5 mg via ORAL
  Filled 2014-09-11: qty 2

## 2014-09-11 MED ORDER — INSULIN ASPART 100 UNIT/ML ~~LOC~~ SOLN
0.0000 [IU] | Freq: Every day | SUBCUTANEOUS | Status: DC
  Administered 2014-09-11: 2 [IU] via SUBCUTANEOUS

## 2014-09-11 MED ORDER — NITROGLYCERIN IN D5W 200-5 MCG/ML-% IV SOLN
0.0000 ug/min | Freq: Once | INTRAVENOUS | Status: AC
  Administered 2014-09-11: 25 ug/min via INTRAVENOUS

## 2014-09-11 NOTE — Plan of Care (Signed)
Problem: Phase I Progression Outcomes Goal: Dyspnea controlled at rest (HF) Outcome: Progressing Goal: Pain controlled with appropriate interventions Outcome: Progressing Goal: Hemodynamically stable Outcome: Not Progressing

## 2014-09-11 NOTE — Progress Notes (Addendum)
Patient ID: Kurt Seminoleobert A Torgeson, male   DOB: [birth year], 48 y.o.   MRN: [id number] [facility] PROGRESS NOTE  Kurt Cummings [id number] DOB: [birth year] DOA: 09/10/2014 PCP: No primary care provider on file.  Brief narrative:    48 -year-old male with past medical history of schizophrenia, ankle injury, who [facility] with worsening shortness of breath over past 1 week prior to this admission. Patient reported shortness of breath is worse when he lays down. Patient did not report fevers or chills. No reports of lower extremity swelling. Patient had complaints of chest discomfort on the admission. On admission, blood pressure was as high as 227/119. With the nitroglycerin drip it trended down to 150/114. HR was 93 - 117, oxygen saturation was 89% on room air. With nasal cannula oxygen support it has improved to 99%. Blood work was significant for elevated white blood cell count is 14, normal troponin level, BNP 957, d-dimer slightly elevated at 0.64. The 12 lead EKG showed sinus tachycardia. Urine drug screen was normal. Urinalysis was significant for glucose of 500, no ketones and no leukocytes. CBG was 184 and glucose on BMET was 269.  Patient was admitted to step down unit for management of accelerated hypertension. Patient is on nitroglycerin drip. Cardiology will see the patient in consultation.   Assessment/Plan:    Principal problem Acute respiratory failure with hypoxia / acute diastolic CHF exacerbation - Patient reports no previous history of CHF. BNP on the admission was 957. - Patient was given Lasix on the admission, we will continue current lasix dose 40 mg IV twice daily. - Chest x-ray showed mild cardiomegaly, interstitial prominence likely representing pulmonary edema with small pleural effusion. - Troponin level 2 negative. The 12-lead EKG on the admission showed sinus tachycardia.  - Weight on the admission was 117.9 kg. This morning, recorded weight  is 123.7 kg. Only trace LE edema on physical exam. Continue daily weight and strict intake and output. Since admission, negative 2.1 L fluid balance. - Replete electrolytes as needed. Potassium was 3.2 this morning and was supplemented with potassium chloride 40 mEq by mouth. - Obtain 2-D echo. - May continue aspirin daily. - Appreciate cardiology consult and their recommendation.    Active Problems: Elevated D dimer / Leukocytosis  - Considering hypoxia, shortness of breath obvious concern is for DVT and possible pulmonary embolism. - Obtain lower extremity venous Doppler. Since patient was tachycardiac and to keep headache on admission I think it would be reasonable to obtain CT chest angio to rule out pulmonary embolism. Patient reports feeling better since admission but still requires [place] oxygen support and he still reports feeling short of breath. - Chest x-ray on admission showed mild cardiomegaly, interstitial prominence likely representing pulmonary edema with small pleural effusion, less likely pneumonia. - May use DuoNeb every 4 hours as needed for shortness of breath Accelerated hypertension - Patient is still on nitroglycerin drip. Blood pressure this morning 169/83. - Patient was started on low-dose lisinopril 5 mg daily on the admission. Will follow up with cardio if plan is for additional PO meds to control BP. New onset type 2 diabetes mellitus, uncontrolled - No previous history of diabetes - A1 on this admission is 10 indicating poor glycemic control - Patient is a sliding scale insulin for now - Appreciate diabetic coordinator recommendations Hypokalemia - Secondary to Lasix - We supplemented with potassium chloride 40 mEq this morning Hyponatremia - Possible from CHF etiology, lasix - Sodium is 131 this  morning. Continue to monitor BMP  DVT Prophylaxis   Lovenox subcutaneous ordered  Code Status: Full.  Family Communication:  plan of care discussed with the  patient Disposition Plan: Home when stable. Remains in SDU since requires nitroglycerin drip.   IV access:   Peripheral IV  Procedures and diagnostic studies:    Dg Chest 2 View 09/10/2014   Mild cardiomegaly, interstitial prominence likely represents pulmonary edema with small pleural effusion. Bibasilar atelectasis versus confluent edema, less likely pneumonia.      Medical Consultants:   Cardiology   Other Consultants:   None   IAnti-Infectives:    None    Manson Passey, MD  [facility] Pager [phone]  If 7PM-7AM, please contact night-coverage [email or web address] Password [facility] 09/11/2014, 9:44 AM   LOS: 1 day    HPI/Subjective: No acute overnight events.  Objective: Filed Vitals:   09/11/14 0630 09/11/14 0700 09/11/14 0800 09/11/14 0814  BP: 165/87 158/88 169/83   Pulse: 102 85 91 94  Temp:      TempSrc:      Resp: 26 21 26 19   Height:      Weight:      SpO2: 94% 95% 94% 95%    Intake/Output Summary (Last 24 hours) at 09/11/14 0944 Last data filed at 09/11/14 0900  Gross per 24 hour  Intake 240.26 ml  Output   2525 ml  Net -2284.74 ml    Exam:   General:  Pt is alert, follows commands appropriately, not in acute distress  Cardiovascular: Regular rate and rhythm, S1/S2 ap  Respiratory: Clear to auscultation bilaterally, no wheezing, no crackles, no rhonchi  Abdomen: Soft, non tender, non distended, bowel sounds present  Extremities: right LE with trace edema and mild superimposed chronic skin changes; Left lower extremity with trace pedal edema, pulses DP and PT palpable bilaterally  Neuro: Grossly nonfocal  Data Reviewed: Basic Metabolic Panel:  Recent Labs Lab 09/10/14 2048 09/11/14 0017 09/11/14 0620  NA 134*  --  131*  K 3.8  --  3.2*  CL 98  --  94*  CO2 20  --  24  GLUCOSE 251*  --  269*  BUN 16  --  14  CREATININE 0.63 0.61 0.65  CALCIUM 9.1  --  8.7  MG  --   --  1.7  PHOS  --   --  2.8   Liver Function  Tests:  Recent Labs Lab 09/11/14 0620  AST 32  ALT 68*  ALKPHOS 87  BILITOT 1.0  PROT 6.5  ALBUMIN 3.5   No results for input(s): LIPASE, AMYLASE in the last 168 hours. No results for input(s): AMMONIA in the last 168 hours. CBC:  Recent Labs Lab 09/10/14 2048 09/11/14 0017 09/11/14 0620  WBC 14.8* 12.3* 9.7  HGB 14.1 12.8* 12.2*  HCT 39.0 36.8* 35.2*  MCV 77.4* 77.8* 78.2  PLT 204 220 212   Cardiac Enzymes:  Recent Labs Lab 09/11/14 0017 09/11/14 0620  TROPONINI <0.30 <0.30   BNP: Invalid input(s): POCBNP CBG:  Recent Labs Lab 09/11/14 0041  GLUCAP 184*    Recent Results (from the past 240 hour(s))  MRSA PCR Screening     Status: None   Collection Time: 09/11/14 12:07 AM  Result Value Ref Range Status   MRSA by PCR NEGATIVE NEGATIVE Final    Comment:            Scheduled Meds: . aspirin EC  81 mg Oral Daily  . docusate sodium  100 mg Oral BID  . enoxaparin (LOVENOX) injection  60 mg Subcutaneous Daily  . furosemide  40 mg Intravenous BID  . insulin aspart  0-15 Units Subcutaneous TID WC  . insulin aspart  0-5 Units Subcutaneous QHS  . lisinopril  5 mg Oral Daily   Continuous Infusions: . nitroGLYCERIN 65 mcg/min (09/11/14 0600)

## 2014-09-11 NOTE — Progress Notes (Signed)
Inpatient Diabetes Program Recommendations  AACE/ADA: New Consensus Statement on Inpatient Glycemic Control (2013)  Target Ranges:  Prepandial:   less than 140 mg/dL      Peak postprandial:   less than 180 mg/dL (1-2 hours)      Critically ill patients:  140 - 180 mg/dL   Reason for Visit: Diabetes Consult - newly-diagnosed DM  Diabetes history: None Outpatient Diabetes medications: None Current orders for Inpatient glycemic control: Novolog moderate tidwc and hs  48 year old male presents to ED with c/o SOB. Hx of Schizophrenia which is untreated. Has not established care with PCP, but states he had made an appt at the [facility] and was to go this afternoon.  Pt states that since his ankle was injured, he has gained approx 30 pounds d/t not walking. Says he used to walk 2 miles/day. Now is very inactive. Has family hx DM. Discussed diagnosis and what HgbA1C means. Talked about insulin resistance, how diet, exercise and stress affect blood sugar, and importance of home glucose monitoring. Will need PCP to manage DM. Pt states he was trying to eat a little more healthy in the past few weeks, limiting the amount of CHOs in diet. Interested in OP Diabetes Education at [facility].  Newly-diagnosed DM with HgbA1C of 10%.  Blood sugars in 200s. Will likely need basal insulin for home.  Inpatient Diabetes Program Recommendations Insulin - Basal: Add Lantus 20 units QHS Insulin - Meal Coverage: Add Novolog 4 units tidwc for meal coverage insulin if pt eats >50% meal HgbA1C: 10.0% - uncontrolled Outpatient Referral: Will order OP Diabetes Education consult for newly-diagnosed DM Diet: Continue with CHO mod med  Note: Will order Living Well With Diabetes book and encouraged pt to view diabetes videos on pt education channel.  If pt is to go home on insulin - will order Insulin Starter Kit and begin teaching insulin administration. It's questionable as to how much pt can retain with diabetes  education. Agree with MD that Schizophrenia needs to be treated. Agree with psych consult. Will f/u in am with questions. Thank you. Lorenda Peck, RD, LDN, CDE Inpatient Diabetes Coordinator [phone]

## 2014-09-11 NOTE — Care Management Note (Signed)
    Page 1 of 2   09/11/2014     4:09:15 PM CARE MANAGEMENT NOTE 09/11/2014  Patient:  Kurt Cummings,Kurt Cummings   Account Number:  [id number]  Date Initiated:  09/11/2014  Documentation initiated by:  DAVIS,RHONDA  Subjective/Objective Assessment:   hypertensive crisis requiring iv ntg drp     Action/Plan:   home when stable   Anticipated DC Date:  09/14/2014   Anticipated DC Plan:  HOME/SELF CARE  In-house referral  NA      DC Planning Services  CM consult      PAC Choice  NA   Choice offered to / List presented to:  NA      DME agency  NA     HH arranged  NA      HH agency  NA   Status of service:  In process, will continue to follow Medicare Important Message given?   (If response is "NO", the following Medicare IM given date fields will be blank) Date Medicare IM given:   Medicare IM given by:   Date Additional Medicare IM given:   Additional Medicare IM given by:    Discharge Disposition:    Per UR Regulation:  Reviewed for med. necessity/level of care/duration of stay  If discussed at Long Length of Stay Meetings, dates discussed:    Comments:  [id number]/Rhonda Earlene Plateravis, RN, BSN, CCM Chart reviewed. Discharge needs and patient's stay to be reviewed and followed by case manager. Chart note for progression of stay: On admission, blood pressure was as high as 227/119. With the nitroglycerin drip it trended down to 150/114. HR was 93 - 117, oxygen saturation was 89% on room air. With nasal cannula oxygen support it has improved to 99%. Blood work was significant for elevated white blood cell count is 14, normal troponin level, BNP 957, d-dimer slightly elevated at 0.64. The 12 lead EKG showed sinus tachycardia. Urine drug screen was normal. Urinalysis was significant for glucose of 500, no ketones and no leukocytes. CBG was 184 and glucose on BMET was 269.   Patient was admitted to step down unit for management of accelerated hypertension. Patient is on nitroglycerin  drip. Cardiology will see the patient in consultation.   Assessment/Plan:   Principal problem Acute respiratory failure with hypoxia / acute diastolic CHF exacerbation - Patient reports no previous history of CHF. BNP on the admission was 957. - Patient was given Lasix on the admission, we will continue current lasix dose 40 mg IV twice daily. - Chest x-ray showed mild cardiomegaly, interstitial prominence likely representing pulmonary edema with small pleural effusion. - Troponin level 2 negative. The 12-lead EKG on the admission showed sinus tachycardia.   - Weight on the admission was 117.9 kg. This morning, recorded weight is 123.7 kg. Only trace LE edema on physical exam. Continue daily weight and strict intake and output. Since admission, negative 2.1 L fluid balance. - Replete electrolytes as needed. Potassium was 3.2 this morning and was supplemented with potassium chloride 40 mEq by mouth. - Obtain 2-D echo. - May continue aspirin daily. - Appreciate cardiology consult and their recommendation.

## 2014-09-11 NOTE — Plan of Care (Signed)
Problem: Phase I Progression Outcomes Goal: Dyspnea controlled at rest (HF) Outcome: Completed/Met Date Met:  09/11/14 Goal: Pain controlled with appropriate interventions Outcome: Completed/Met Date Met:  09/11/14     

## 2014-09-11 NOTE — Progress Notes (Signed)
VASCULAR LAB PRELIMINARY  PRELIMINARY  PRELIMINARY  PRELIMINARY  Bilateral lower extremity venous duplex completed.    Preliminary report:  Bilateral:  No evidence of DVT, superficial thrombosis, or Baker's Cyst.   Staton Markey, RVS 09/11/2014, 1:14 PM

## 2014-09-11 NOTE — Progress Notes (Signed)
Brief Rx note:  Lovenox Wt=123 kg, CrCl~145 ml/min, BMI=40.2  Lovenox adusted to 60mg  daily in pt with BMI>30.  Thanks, Lorenza EvangelistGreen, Shekina Cordell R 09/11/2014 12:37 AM

## 2014-09-11 NOTE — Consult Note (Addendum)
CONSULTATION NOTE  Reason for Consult: Chest pain, hypertensive emergency  Requesting Physician: Dr. Charlies Silvers  Cardiologist: None (new)  HPI: This is a 48 y.o. male with a past medical history significant for untreated schizophrenia, varicose veins status post vein stripping in the left leg, possible congestive heart failure and hypertension. He presents with worsening shortness of breath that is worse when laying flat as well as substernal chest pain. He was noted to be markedly hypertensive on admission with blood pressures of 227/119. He was placed on nitroglycerin drip and blood pressure improved. He was noted to be hypoxic to 89% on room air. Troponin was negative however BNP is elevated at 957. No ischemic EKG changes were noted. Cardiology was consult to regarding malignant hypertension, possible heart failure and chest pain.  PMHx:  Past Medical History  Diagnosis Date  . Ankle injury   . Schizophrenia    Past Surgical History  Procedure Laterality Date  . Wisdom tooth extraction    . Varicose vein surgery      FAMHx: Family History  Problem Relation Age of Onset  . Diabetes Father   . Diabetes Brother   . Hypertension Brother     SOCHx:  reports that he has never smoked. He does not have any smokeless tobacco history on file. He reports that he does not drink alcohol or use illicit drugs.  ALLERGIES: No Known Allergies  ROS: A comprehensive review of systems was negative except for: Respiratory: positive for dyspnea on exertion Cardiovascular: positive for chest pressure/discomfort, fatigue, lower extremity edema, orthopnea and paroxysmal nocturnal dyspnea Musculoskeletal: positive for right ankle pain  HOME MEDICATIONS: Prescriptions prior to admission  Medication Sig Dispense Refill Last Dose  . acetaminophen (TYLENOL) 650 MG CR tablet Take 650 mg by mouth every 8 (eight) hours as needed for pain.   09/10/2014 at Unknown time  . aspirin 325 MG tablet  Take 325 mg by mouth once.   Past Week at Unknown time  . caffeine 200 MG TABS tablet Take 100 mg by mouth daily as needed (to help stay awake).   09/10/2014 at Unknown time  . Multiple Vitamin (MULTIVITAMIN WITH MINERALS) TABS tablet Take 1 tablet by mouth daily.   Past Week at Unknown time    HOSPITAL MEDICATIONS: Scheduled: . aspirin EC  81 mg Oral Daily  . docusate sodium  100 mg Oral BID  . enoxaparin (LOVENOX) injection  60 mg Subcutaneous Daily  . furosemide  40 mg Intravenous Q12H  . insulin aspart  0-15 Units Subcutaneous TID WC  . insulin aspart  0-5 Units Subcutaneous QHS  . lisinopril  5 mg Oral Daily  . potassium chloride  40 mEq Oral Once  . sodium chloride  3 mL Intravenous Q12H    VITALS: Blood pressure 169/83, pulse 94, temperature 98.3 F (36.8 C), temperature source Oral, resp. rate 19, height 5' 9"  (1.753 m), weight 272 lb 11.3 oz (123.7 kg), SpO2 95 %.  PHYSICAL EXAM: General appearance: alert, no distress and morbidly obese Neck: no carotid bruit and no JVD Lungs: diminished breath sounds bilaterally and rales LLL Heart: regular rate and rhythm Abdomen: soft, non-tender; bowel sounds normal; no masses,  no organomegaly and obese Extremities: varicose veins noted, venous stasis dermatitis noted and 1+ LLE edema Pulses: 2+ and symmetric Skin: pale, warm, dry, numerous excoriations Neurologic: Mental status: Alert, oriented Psych: Mechanical speech, flat affect  LABS: Results for orders placed or performed during the hospital encounter of 09/10/14 (from the  past 48 hour(s))  BNP (order ONLY if patient complains of dyspnea/SOB AND you have documented it for THIS visit)     Status: Abnormal   Collection Time: 09/10/14  8:47 PM  Result Value Ref Range   Pro B Natriuretic peptide (BNP) 957.1 (H) 0 - 125 pg/mL  Basic metabolic panel     Status: Abnormal   Collection Time: 09/10/14  8:48 PM  Result Value Ref Range   Sodium 134 (L) 137 - 147 mEq/L   Potassium  3.8 3.7 - 5.3 mEq/L   Chloride 98 96 - 112 mEq/L   CO2 20 19 - 32 mEq/L   Glucose, Bld 251 (H) 70 - 99 mg/dL   BUN 16 6 - 23 mg/dL   Creatinine, Ser 0.63 0.50 - 1.35 mg/dL   Calcium 9.1 8.4 - 10.5 mg/dL   GFR calc non Af Amer >90 >90 mL/min   GFR calc Af Amer >90 >90 mL/min    Comment: (NOTE) The eGFR has been calculated using the CKD EPI equation. This calculation has not been validated in all clinical situations. eGFR's persistently <90 mL/min signify possible Chronic Kidney Disease.    Anion gap 16 (H) 5 - 15  CBC     Status: Abnormal   Collection Time: 09/10/14  8:48 PM  Result Value Ref Range   WBC 14.8 (H) 4.0 - 10.5 K/uL   RBC 5.04 4.22 - 5.81 MIL/uL   Hemoglobin 14.1 13.0 - 17.0 g/dL   HCT 39.0 39.0 - 52.0 %   MCV 77.4 (L) 78.0 - 100.0 fL   MCH 28.0 26.0 - 34.0 pg   MCHC 36.2 (H) 30.0 - 36.0 g/dL   RDW 13.5 11.5 - 15.5 %   Platelets 204 150 - 400 K/uL  I-stat troponin, ED (not at [facility])     Status: None   Collection Time: 09/10/14  9:03 PM  Result Value Ref Range   Troponin i, poc 0.03 0.00 - 0.08 ng/mL   Comment 3            Comment: Due to the release kinetics of cTnI, a negative result within the first hours of the onset of symptoms does not rule out myocardial infarction with certainty. If myocardial infarction is still suspected, repeat the test at appropriate intervals.   D-dimer, quantitative     Status: Abnormal   Collection Time: 09/10/14 11:51 PM  Result Value Ref Range   D-Dimer, Quant 0.64 (H) 0.00 - 0.48 ug/mL-FEU    Comment:        AT THE INHOUSE ESTABLISHED CUTOFF VALUE OF 0.48 ug/mL FEU, THIS ASSAY HAS BEEN DOCUMENTED IN THE LITERATURE TO HAVE A SENSITIVITY AND NEGATIVE PREDICTIVE VALUE OF AT LEAST 98 TO 99%.  THE TEST RESULT SHOULD BE CORRELATED WITH AN ASSESSMENT OF THE CLINICAL PROBABILITY OF DVT / VTE.   MRSA PCR Screening     Status: None   Collection Time: 09/11/14 12:07 AM  Result Value Ref Range   MRSA by PCR NEGATIVE NEGATIVE      Comment:        The GeneXpert MRSA Assay (FDA approved for NASAL specimens only), is one component of a comprehensive MRSA colonization surveillance program. It is not intended to diagnose MRSA infection nor to guide or monitor treatment for MRSA infections.   CBC     Status: Abnormal   Collection Time: 09/11/14 12:17 AM  Result Value Ref Range   WBC 12.3 (H) 4.0 - 10.5 K/uL   RBC  4.73 4.22 - 5.81 MIL/uL   Hemoglobin 12.8 (L) 13.0 - 17.0 g/dL   HCT 36.8 (L) 39.0 - 52.0 %   MCV 77.8 (L) 78.0 - 100.0 fL   MCH 27.1 26.0 - 34.0 pg   MCHC 34.8 30.0 - 36.0 g/dL   RDW 13.5 11.5 - 15.5 %   Platelets 220 150 - 400 K/uL  Creatinine, serum     Status: None   Collection Time: 09/11/14 12:17 AM  Result Value Ref Range   Creatinine, Ser 0.61 0.50 - 1.35 mg/dL   GFR calc non Af Amer >90 >90 mL/min   GFR calc Af Amer >90 >90 mL/min    Comment: (NOTE) The eGFR has been calculated using the CKD EPI equation. This calculation has not been validated in all clinical situations. eGFR's persistently <90 mL/min signify possible Chronic Kidney Disease.   Troponin I     Status: None   Collection Time: 09/11/14 12:17 AM  Result Value Ref Range   Troponin I <0.30 <0.30 ng/mL    Comment:        Due to the release kinetics of cTnI, a negative result within the first hours of the onset of symptoms does not rule out myocardial infarction with certainty. If myocardial infarction is still suspected, repeat the test at appropriate intervals.   Hemoglobin A1c     Status: Abnormal   Collection Time: 09/11/14 12:17 AM  Result Value Ref Range   Hgb A1c MFr Bld 10.0 (H) <5.7 %    Comment: (NOTE)                                                                       According to the ADA Clinical Practice Recommendations for 2011, when HbA1c is used as a screening test:  >=6.5%   Diagnostic of Diabetes Mellitus           (if abnormal result is confirmed) 5.7-6.4%   Increased risk of developing  Diabetes Mellitus References:Diagnosis and Classification of Diabetes Mellitus,Diabetes KWIO,9735,32(DJMEQ 1):S62-S69 and Standards of Medical Care in         Diabetes - 2011,Diabetes Care,2011,34 (Suppl 1):S11-S61.    Mean Plasma Glucose 240 (H) <117 mg/dL    Comment: Performed at [facility]  Glucose, capillary     Status: Abnormal   Collection Time: 09/11/14 12:41 AM  Result Value Ref Range   Glucose-Capillary 184 (H) 70 - 99 mg/dL  Urinalysis, Routine w reflex microscopic     Status: Abnormal   Collection Time: 09/11/14  1:50 AM  Result Value Ref Range   Color, Urine YELLOW YELLOW   APPearance CLEAR CLEAR   Specific Gravity, Urine 1.014 1.005 - 1.030   pH 6.0 5.0 - 8.0   Glucose, UA 500 (A) NEGATIVE mg/dL   Hgb urine dipstick NEGATIVE NEGATIVE   Bilirubin Urine NEGATIVE NEGATIVE   Ketones, ur NEGATIVE NEGATIVE mg/dL   Protein, ur NEGATIVE NEGATIVE mg/dL   Urobilinogen, UA 0.2 0.0 - 1.0 mg/dL   Nitrite NEGATIVE NEGATIVE   Leukocytes, UA NEGATIVE NEGATIVE    Comment: MICROSCOPIC NOT DONE ON URINES WITH NEGATIVE PROTEIN, BLOOD, LEUKOCYTES, NITRITE, OR GLUCOSE <1000 mg/dL.  Urine rapid drug screen (hosp performed)     Status: None  Collection Time: 09/11/14  1:50 AM  Result Value Ref Range   Opiates NONE DETECTED NONE DETECTED   Cocaine NONE DETECTED NONE DETECTED   Benzodiazepines NONE DETECTED NONE DETECTED   Amphetamines NONE DETECTED NONE DETECTED   Tetrahydrocannabinol NONE DETECTED NONE DETECTED   Barbiturates NONE DETECTED NONE DETECTED    Comment:        DRUG SCREEN FOR MEDICAL PURPOSES ONLY.  IF CONFIRMATION IS NEEDED FOR ANY PURPOSE, NOTIFY LAB WITHIN 5 DAYS.        LOWEST DETECTABLE LIMITS FOR URINE DRUG SCREEN Drug Class       Cutoff (ng/mL) Amphetamine      1000 Barbiturate      200 Benzodiazepine   956 Tricyclics       387 Opiates          300 Cocaine          300 THC              50   Magnesium     Status: None   Collection Time: 09/11/14   6:20 AM  Result Value Ref Range   Magnesium 1.7 1.5 - 2.5 mg/dL  Phosphorus     Status: None   Collection Time: 09/11/14  6:20 AM  Result Value Ref Range   Phosphorus 2.8 2.3 - 4.6 mg/dL  Comprehensive metabolic panel     Status: Abnormal   Collection Time: 09/11/14  6:20 AM  Result Value Ref Range   Sodium 131 (L) 137 - 147 mEq/L   Potassium 3.2 (L) 3.7 - 5.3 mEq/L   Chloride 94 (L) 96 - 112 mEq/L   CO2 24 19 - 32 mEq/L   Glucose, Bld 269 (H) 70 - 99 mg/dL   BUN 14 6 - 23 mg/dL   Creatinine, Ser 0.65 0.50 - 1.35 mg/dL   Calcium 8.7 8.4 - 10.5 mg/dL   Total Protein 6.5 6.0 - 8.3 g/dL   Albumin 3.5 3.5 - 5.2 g/dL   AST 32 0 - 37 U/L   ALT 68 (H) 0 - 53 U/L   Alkaline Phosphatase 87 39 - 117 U/L   Total Bilirubin 1.0 0.3 - 1.2 mg/dL   GFR calc non Af Amer >90 >90 mL/min   GFR calc Af Amer >90 >90 mL/min    Comment: (NOTE) The eGFR has been calculated using the CKD EPI equation. This calculation has not been validated in all clinical situations. eGFR's persistently <90 mL/min signify possible Chronic Kidney Disease.    Anion gap 13 5 - 15  CBC     Status: Abnormal   Collection Time: 09/11/14  6:20 AM  Result Value Ref Range   WBC 9.7 4.0 - 10.5 K/uL   RBC 4.50 4.22 - 5.81 MIL/uL   Hemoglobin 12.2 (L) 13.0 - 17.0 g/dL   HCT 35.2 (L) 39.0 - 52.0 %   MCV 78.2 78.0 - 100.0 fL   MCH 27.1 26.0 - 34.0 pg   MCHC 34.7 30.0 - 36.0 g/dL   RDW 13.6 11.5 - 15.5 %   Platelets 212 150 - 400 K/uL  Troponin I     Status: None   Collection Time: 09/11/14  6:20 AM  Result Value Ref Range   Troponin I <0.30 <0.30 ng/mL    Comment:        Due to the release kinetics of cTnI, a negative result within the first hours of the onset of symptoms does not rule out myocardial infarction with certainty. If  myocardial infarction is still suspected, repeat the test at appropriate intervals.     IMAGING: Dg Chest 2 View  09/10/2014   CLINICAL DATA:  Cough for 1 week. Shortness of breath at  night when lying down.  EXAM: CHEST  2 VIEW  COMPARISON:  None.  FINDINGS: The cardiac silhouette appears mildly enlarged. Mediastinal silhouette is nonsuspicious. Central pulmonary vascular congestion and interstitial prominence. Small pleural effusion, likely on the LEFT and seen only on lateral. Bibasilar strandy densities. Mildly elevated RIGHT hemidiaphragm. No pneumothorax. Soft tissue planes and included osseous structures unremarkable.  IMPRESSION: Mild cardiomegaly, interstitial prominence likely represents pulmonary edema with small pleural effusion. Bibasilar atelectasis versus confluent edema, less likely pneumonia.   Electronically Signed   By: Elon Alas   On: 09/10/2014 21:38    HOSPITAL DIAGNOSES: Principal Problem:   Hypertensive emergency Active Problems:   Dyspnea   Hypertensive crisis   New onset type 2 diabetes mellitus   Prolonged QT interval   Schizophrenia   Leukocytosis   IMPRESSION: 1. Hypertensive emergency 2. Chest pain likely secondary to problem #1 3. Acute pulmonary edema, possible congestive heart failure 4. Newly diagnosed type 2 diabetes 5. Untreated psychiatric disorder-possibly schizophrenia 6. Positive d-dimer  RECOMMENDATION: 1. Mr. Furtick presents with hypertensive emergency and is improved on nitroglycerin drip. His blood pressure has been reduced at least 25-35% over the past 24 hours. At this point I feel we can further lower his blood pressure with oral blood pressure agents. I would recommend starting beta blocker and ARB medications and attempt to wean down the nitroglycerin. Stop low-dose ACE-I. He is ruled out for MI. I agree with checking an echocardiogram to evaluate for motion abnormalities and/or systolic/diastolic dysfunction. He is also scheduled to have a CT angiogram which will be helpful to further determine if there is coronary calcium and to rule out PE. I agree with lower extremity Dopplers however suspect that his left lower  extremity swelling is due to chronic venous insufficiency and his prior history of vein stripping. He may ultimately need a stress test to evaluate for coronary artery disease. Would strongly recommend inpatient psychiatric evaluation as well, which may help with future medication compliance.  Thanks for consulting [place].  Time Spent Directly with Patient: 45 minutes  Pixie Casino, MD, [facility] Attending Cardiologist [facility]  Ripken Rekowski C 09/11/2014, 10:48 AM

## 2014-09-12 ENCOUNTER — Encounter (HOSPITAL_COMMUNITY): Payer: Self-pay | Admitting: Internal Medicine

## 2014-09-12 DIAGNOSIS — I5021 Acute systolic (congestive) heart failure: Secondary | ICD-10-CM | POA: Diagnosis present

## 2014-09-12 DIAGNOSIS — J9601 Acute respiratory failure with hypoxia: Secondary | ICD-10-CM | POA: Diagnosis present

## 2014-09-12 DIAGNOSIS — R079 Chest pain, unspecified: Secondary | ICD-10-CM | POA: Diagnosis present

## 2014-09-12 DIAGNOSIS — E876 Hypokalemia: Secondary | ICD-10-CM

## 2014-09-12 DIAGNOSIS — R072 Precordial pain: Secondary | ICD-10-CM

## 2014-09-12 DIAGNOSIS — I5031 Acute diastolic (congestive) heart failure: Secondary | ICD-10-CM | POA: Diagnosis present

## 2014-09-12 LAB — BASIC METABOLIC PANEL
Anion gap: 13 (ref 5–15)
BUN: 16 mg/dL (ref 6–23)
CALCIUM: 9.1 mg/dL (ref 8.4–10.5)
CO2: 26 mEq/L (ref 19–32)
Chloride: 96 mEq/L (ref 96–112)
Creatinine, Ser: 0.98 mg/dL (ref 0.50–1.35)
GFR calc Af Amer: >90 mL/min (ref >90)
GFR calc non Af Amer: >90 mL/min (ref >90)
GLUCOSE: 194 mg/dL — AB (ref 70–99)
Potassium: 3.4 mEq/L — ABNORMAL LOW (ref 3.7–5.3)
Sodium: 135 mEq/L — ABNORMAL LOW (ref 137–147)

## 2014-09-12 LAB — GLUCOSE, CAPILLARY
GLUCOSE-CAPILLARY: 197 mg/dL — AB (ref 70–99)
Glucose-Capillary: 167 mg/dL — ABNORMAL HIGH (ref 70–99)
Glucose-Capillary: 157 mg/dL — ABNORMAL HIGH (ref 70–99)
Glucose-Capillary: 217 mg/dL — ABNORMAL HIGH (ref 70–99)

## 2014-09-12 LAB — LIPID PANEL
CHOL/HDL RATIO: 3.3 ratio
Cholesterol: 117 mg/dL (ref 0–200)
HDL: 35 mg/dL — AB (ref >39)
LDL CALC: 49 mg/dL (ref 0–99)
Triglycerides: 165 mg/dL — ABNORMAL HIGH (ref <150)
VLDL: 33 mg/dL (ref 0–40)

## 2014-09-12 MED ORDER — PNEUMOCOCCAL VAC POLYVALENT 25 MCG/0.5ML IJ INJ
0.5000 mL | INJECTION | INTRAMUSCULAR | Status: AC
  Administered 2014-09-13: 0.5 mL via INTRAMUSCULAR
  Filled 2014-09-12 (×2): qty 0.5

## 2014-09-12 MED ORDER — INSULIN ASPART 100 UNIT/ML ~~LOC~~ SOLN
0.0000 [IU] | Freq: Three times a day (TID) | SUBCUTANEOUS | Status: DC
  Administered 2014-09-12 (×3): 3 [IU] via SUBCUTANEOUS
  Administered 2014-09-13: 2 [IU] via SUBCUTANEOUS
  Administered 2014-09-13 – 2014-09-14 (×3): 3 [IU] via SUBCUTANEOUS
  Administered 2014-09-14: 2 [IU] via SUBCUTANEOUS
  Administered 2014-09-15: 3 [IU] via SUBCUTANEOUS
  Administered 2014-09-15 – 2014-09-16 (×2): 2 [IU] via SUBCUTANEOUS

## 2014-09-12 MED ORDER — INFLUENZA VAC SPLIT QUAD 0.5 ML IM SUSY
0.5000 mL | PREFILLED_SYRINGE | INTRAMUSCULAR | Status: AC
  Administered 2014-09-13: 0.5 mL via INTRAMUSCULAR
  Filled 2014-09-12 (×2): qty 0.5

## 2014-09-12 MED ORDER — INSULIN GLARGINE 100 UNIT/ML ~~LOC~~ SOLN: 10.0000 [IU] | Freq: Every day | SUBCUTANEOUS | Status: DC

## 2014-09-12 MED ORDER — POTASSIUM CHLORIDE CRYS ER 20 MEQ PO TBCR
20.0000 meq | EXTENDED_RELEASE_TABLET | Freq: Two times a day (BID) | ORAL | Status: DC
  Administered 2014-09-12 – 2014-09-14 (×5): 20 meq via ORAL
  Filled 2014-09-12 (×6): qty 1

## 2014-09-12 MED ORDER — METOPROLOL TARTRATE 25 MG PO TABS
25.0000 mg | ORAL_TABLET | Freq: Two times a day (BID) | ORAL | Status: DC
  Administered 2014-09-12 – 2014-09-14 (×6): 25 mg via ORAL
  Filled 2014-09-12 (×8): qty 1

## 2014-09-12 MED ORDER — INSULIN ASPART 100 UNIT/ML ~~LOC~~ SOLN
0.0000 [IU] | Freq: Every day | SUBCUTANEOUS | Status: DC
  Administered 2014-09-12: 2 [IU] via SUBCUTANEOUS

## 2014-09-12 NOTE — Progress Notes (Addendum)
     SUBJECTIVE: Breathing is improved.   BP 159/109 mmHg  Pulse 78  Temp(Src) 97.7 F (36.5 C) (Oral)  Resp 19  Ht 5\' 9"  (1.753 m)  Wt 262 lb 5.6 oz (119 kg)  BMI 38.72 kg/m2  SpO2 94%  Intake/Output Summary (Last 24 hours) at 09/12/14 16100621 Last data filed at 09/12/14 0359  Gross per 24 hour  Intake 678.48 ml  Output   2325 ml  Net -1646.52 ml    PHYSICAL EXAM General: Well developed, well nourished, in no acute distress. Alert and oriented x 3.  Psych:  Good affect, responds appropriately Neck: + JVD. No masses noted.  Lungs: Clear bilaterally with no wheezes or rhonci noted.  Heart: RRR with no murmurs noted. Abdomen: Bowel sounds are present. Soft, non-tender.  Extremities: Trace left lower extremity edema. No right lower ext edema   LABS: Basic Metabolic Panel:  Recent Labs  96/01/5410/19/15 0620 09/12/14 0404  NA 131* 135*  K 3.2* 3.4*  CL 94* 96  CO2 24 26  GLUCOSE 269* 194*  BUN 14 16  CREATININE 0.65 0.98  CALCIUM 8.7 9.1  MG 1.7  --   PHOS 2.8  --    CBC:  Recent Labs  09/11/14 0017 09/11/14 0620  WBC 12.3* 9.7  HGB 12.8* 12.2*  HCT 36.8* 35.2*  MCV 77.8* 78.2  PLT 220 212   Cardiac Enzymes:  Recent Labs  09/11/14 0017 09/11/14 0620 09/11/14 1217  TROPONINI <0.30 <0.30 <0.30   Current Meds: . aspirin EC  81 mg Oral Daily  . docusate sodium  100 mg Oral BID  . enoxaparin (LOVENOX) injection  60 mg Subcutaneous Daily  . furosemide  40 mg Intravenous Q12H  . insulin aspart  0-15 Units Subcutaneous TID WC  . insulin aspart  0-5 Units Subcutaneous QHS  . irbesartan  150 mg Oral Daily  . metoprolol tartrate  12.5 mg Oral BID  . sodium chloride  3 mL Intravenous Q12H   Echo 09/11/14: Left ventricle: The cavity size was normal. Wall thickness was increased in a pattern of moderate LVH. Systolic function was mildly reduced. The estimated ejection fraction was in the range of 45% to 50%. Anteroseptal and septal hypokinesis.  Doppler parameters are consistent with abnormal left ventricular relaxation (grade 1 diastolic dysfunction). The E/e&' ratio is >8, suggesting indeterminate LV filling pressure. - Left atrium: The atrium was mildly dilated. - Right atrium: The atrium was mildly dilated. Impressions: - LVEF 45-50%, moderate LVH, anteroseptal and septal hypokinesis, diastolic dysfunction, borderline elevated LV filling pressure.  ASSESSMENT AND PLAN: Seen by Dr. Rennis GoldenHilty 09/11/14 as new cardiology consult for chest pain, volume overload, HTN.   1. Hypertensive emergency: BP much improved. Now on oral agents. Continue ARB/beta blocker. Titrate over weekend for better BP control.   2.  Chest pain: No evidence of ACS. Chest pain now resolved. Echo with possible wall motion abnormalities. Stress test before discharge after he is out on the floor, ambulating.   3. Acute pulmonary edema/Acute diastolic CHF: Likely secondary to malignant hypertension. Moderate LVH and mild LV systolic dysfunction on echo 09/11/14. Possible anteroseptal and septal hypokinesis. Would arrange stress myoview when clinically stable and out of ICU. Continue IV Lasix today as he still appears to be volume overloaded.    Should be ok for transfer to telemetry unit today.   Chun Sellen  11/20/20156:21 AM

## 2014-09-12 NOTE — Plan of Care (Signed)
Problem: Phase II Progression Outcomes Goal: Patient able to draw up & self administer Insulin Outcome: Completed/Met Date Met:  09/12/14

## 2014-09-12 NOTE — Progress Notes (Signed)
Patient arrived to room at approximately 1847. Pleasant. Ambulates in the room. Denies pain. Awaiting for his dinner tray. Father was present on transfer. Will continue to monitor the patient. - Hulda Marinonna Tamryn Popko RN

## 2014-09-12 NOTE — Progress Notes (Signed)
Inpatient Diabetes Program Recommendations  AACE/ADA: New Consensus Statement on Inpatient Glycemic Control (2013)  Target Ranges:  Prepandial:   less than 140 mg/dL      Peak postprandial:   less than 180 mg/dL (1-2 hours)      Critically ill patients:  140 - 180 mg/dL   Reason for Visit: Hyperglycemia  Results for [name], Moiz A (MRN [id number]) as of 09/12/2014 16:37  Ref. Range 09/11/2014 12:01 09/11/2014 16:46 09/11/2014 21:44 09/12/2014 07:51 09/12/2014 12:57  Glucose-Capillary Latest Range: 70-99 mg/dL 409186 (H) 811193 (H) 914223 (H) 197 (H) 167 (H)   Blood sugars improving. Pt gave first insulin injection at lunch today. Will need continued reinforcement with education. Discussed monitoring, diet in depth including CHOs and portion control, importance of getting more physical activity and hypoglycemia, s/s and treatment. Encouraged pt to view videos. Pt has diabetes book, but has not read.   Recommend pt be discharged on very simple insulin regimen - possibly basal insulin with metformin, januvia.  Order# for syringes - [id number] Will need meter and supplies for glucose monitoring. Order [id number] Will need PCP to manage DM. Recommend [facility].  Interested in OP Diabetes Education and order has been placed. Agree with MD that Schizophrenia needs to be treated. Somewhat hesitant about pt going home on insulin. Taking insulin, then possibly forgetting he took it, then taking again. Agree with psych consult. Discussed above with RN.  Will continue to follow. Thank you. Ailene Ardshonda Jovana Rembold, RD, LDN, CDE Inpatient Diabetes Coordinator [phone]

## 2014-09-12 NOTE — Plan of Care (Signed)
Problem: Phase II Progression Outcomes Goal: Patient able to draw up & self administer Insulin Outcome: Progressing Demonstrated how to draw up insulin.  Patient verbalized understanding.. Discussed the importance of drawing up the correct number of units.   Patient injected 3 units of novolog in his left lower quadrant. He demonstrated effective administered.

## 2014-09-12 NOTE — Progress Notes (Addendum)
Progress Note   Kurt Cummings [id number] DOB: [birth year] DOA: 09/10/2014 PCP: No primary care provider on file.   Brief Narrative:   Kurt Cummings is an 48 y.o. male with a PMH of schizophrenia, who was admitted 09/10/2014 with a one-week history of worsening shortness of breath.  On admission, blood pressure was as high as 227/119. Patient was treated with a nitroglycerin drip with improvement in BP. He was also mildly hypoxic with an oxygen saturation of 89% on room air, treated with oxygen via nasal cannula.  Blood work was significant for elevated WBC of 14, normal troponin level, BNP 957, d-dimer slightly elevated at 0.64. The 12 lead EKG showed sinus tachycardia. Urine drug screen was normal. Urinalysis was significant for glucose of 500, no ketones and no leukocytes. CBG was 184 and glucose on BMET was 269.  Patient was admitted to step down unit for management of accelerated hypertension.  Assessment/Plan:   Principal Problem:   Acute respiratory failure with hypoxia secondary to acute systolic and diastolic CHF / dyspnea / chest pain  Patient was admitted and placed on Lasix and nitroglycerin.  Troponins negative 3 sets. Continue aspirin therapy.  Cardiology consultation done by Dr. Rennis GoldenHilty 09/01/14, with recommendations to control blood pressure.  2-D echo done 09/11/14 which showed mildly reduced systolic function with an EF of 45-50 percent and grade 1 diastolic dysfunction.  I/O balance negative by 1.6 L over the past 24 hours. Weight is down by about 10 pounds.  Given elevated d-dimer, CT angiogram was done to rule out PE, which was negative.  Will need stress test prior to D/C.  Active Problems:   Hypertensive crisis/emergency  Initially treated with a nitroglycerin drip. Systolic blood pressures in the 150s.  Continue furosemide, Avapro, metoprolol.  Increase metoprolol to 25 mg BID.    New onset type 2 diabetes mellitus  Hemoglobin A1c  10%.  Currently being managed with moderate scale SSI. CBGs 184-228. Add Lantus 20 units daily at bedtime and 4 units of meal coverage 3 times a day.  Check lipids.    Hypokalemia   Secondary to diuretics. Start on replacement therapy.    Prolonged QT interval  Monitor on telemetry.    Schizophrenia  Does not appear to be on any psychiatric medications.    Leukocytosis  No evidence of infection, resolved.    Hyponatremia  Likely secondary to CHF physiology.    DVT Prophylaxis  Continue Lovenox.  Code Status: Full. Family Communication: No family at the bedside. Disposition Plan: Home when stable.   IV Access:    Peripheral IV   Procedures and diagnostic studies:   Dg Chest 2 View 09/10/2014 Mild cardiomegaly, interstitial prominence likely represents pulmonary edema with small pleural effusion. Bibasilar atelectasis versus confluent edema, less likely pneumonia.   Ct Angio Chest Pe W/cm &/or Wo Cm 09/11/2014: 1. No evidence acute pulmonary embolism. 2. Bilateral pleural effusions and dense bibasilar passive atelectasis.  2D echocardiogram 09/11/14: Moderate LVH. Mildly reduced systolic function with an EF of 45-50 percent. Anteroseptal and septal hypokinesis. Grade 1 diastolic dysfunction.    Medical Consultants:    Dr. Zoila ShutterKenneth Hilty, Cardiology.  Anti-Infectives:    None.  Subjective:    Kurt Cummings tells me he has a residual headache from the NTG.  No chest pain.  Dyspnea improved.  Objective:    Filed Vitals:   09/12/14 0200 09/12/14 0358 09/12/14 0400 09/12/14 0600  BP: 150/69  159/78 159/109  Pulse: 91  92 78  Temp:  97.7 F (36.5 C)    TempSrc:  Oral    Resp: 21  21 19   Height:      Weight:  119 kg (262 lb 5.6 oz)    SpO2: 96%  95% 94%    Intake/Output Summary (Last 24 hours) at 09/12/14 1610 Last data filed at 09/12/14 0359  Gross per 24 hour  Intake 649.08 ml  Output   2325 ml  Net -1675.92 ml    Exam: Gen:   NAD Cardiovascular:  RRR, No M/R/G, +JVD Respiratory:  Lungs CTAB Gastrointestinal:  Abdomen soft, NT/ND, + BS Extremities:  No C/E/C, hemosiderin deposits, LLE with trace edema   Data Reviewed:    Labs: Basic Metabolic Panel:  Recent Labs Lab 09/10/14 2048 09/11/14 0017 09/11/14 0620 09/12/14 0404  NA 134*  --  131* 135*  K 3.8  --  3.2* 3.4*  CL 98  --  94* 96  CO2 20  --  24 26  GLUCOSE 251*  --  269* 194*  BUN 16  --  14 16  CREATININE 0.63 0.61 0.65 0.98  CALCIUM 9.1  --  8.7 9.1  MG  --   --  1.7  --   PHOS  --   --  2.8  --    GFR Estimated Creatinine Clearance: 116.1 mL/min (by C-G formula based on Cr of 0.98). Liver Function Tests:  Recent Labs Lab 09/11/14 0620  AST 32  ALT 68*  ALKPHOS 87  BILITOT 1.0  PROT 6.5  ALBUMIN 3.5    CBC:  Recent Labs Lab 09/10/14 2048 09/11/14 0017 09/11/14 0620  WBC 14.8* 12.3* 9.7  HGB 14.1 12.8* 12.2*  HCT 39.0 36.8* 35.2*  MCV 77.4* 77.8* 78.2  PLT 204 220 212   Cardiac Enzymes:  Recent Labs Lab 09/11/14 0017 09/11/14 0620 09/11/14 1217  TROPONINI <0.30 <0.30 <0.30   BNP (last 3 results)  Recent Labs  09/10/14 2047  PROBNP 957.1*   CBG:  Recent Labs Lab 09/11/14 0041 09/11/14 0815 09/11/14 1201 09/11/14 1646 09/11/14 2144  GLUCAP 184* 248* 186* 193* 223*   D-Dimer:  Recent Labs  09/10/14 2351  DDIMER 0.64*   Hgb A1c:  Recent Labs  09/11/14 0017  HGBA1C 10.0*   Lipid Profile: No results for input(s): CHOL, HDL, LDLCALC, TRIG, CHOLHDL, LDLDIRECT in the last 72 hours. Thyroid function studies:  Recent Labs  09/11/14 0620  TSH 3.590   Microbiology Recent Results (from the past 240 hour(s))  MRSA PCR Screening     Status: None   Collection Time: 09/11/14 12:07 AM  Result Value Ref Range Status   MRSA by PCR NEGATIVE NEGATIVE Final    Comment:        The GeneXpert MRSA Assay (FDA approved for NASAL specimens only), is one component of a comprehensive MRSA  colonization surveillance program. It is not intended to diagnose MRSA infection nor to guide or monitor treatment for MRSA infections.      Medications:   . aspirin EC  81 mg Oral Daily  . docusate sodium  100 mg Oral BID  . enoxaparin (LOVENOX) injection  60 mg Subcutaneous Daily  . furosemide  40 mg Intravenous Q12H  . insulin aspart  0-15 Units Subcutaneous TID WC  . insulin aspart  0-5 Units Subcutaneous QHS  . irbesartan  150 mg Oral Daily  . metoprolol tartrate  12.5 mg Oral BID  . sodium chloride  3 mL Intravenous Q12H  Continuous Infusions: . nitroGLYCERIN Stopped (09/11/14 1800)    Time spent: 35 minutes with > 50% of time discussing current diagnostic test results, clinical impression and plan of care.    LOS: 2 days   Sophiana Milanese  [facility] Pager [phone]. If unable to reach me by pager, please call my cell phone at [phone].  *Please refer to [email or web address], password [email or web address] to get updated schedule on who will round on this patient, as hospitalists switch teams weekly. If 7PM-7AM, please contact night-coverage at [email or web address], password [email or web address] for any overnight needs.  09/12/2014, 7:12 AM

## 2014-09-12 NOTE — Plan of Care (Signed)
Problem: Phase I Progression Outcomes Goal: Voiding-avoid urinary catheter unless indicated Outcome: Completed/Met Date Met:  09/12/14 Goal: Hemodynamically stable Outcome: Progressing  Problem: Phase II Progression Outcomes Goal: Pain controlled Outcome: Progressing Goal: Tolerating diet Outcome: Progressing Goal: Fluid volume status improved Outcome: Progressing

## 2014-09-12 NOTE — Plan of Care (Signed)
Problem: Phase I Progression Outcomes Goal: Pain controlled with appropriate interventions Outcome: Progressing Goal: Voiding-avoid urinary catheter unless indicated Outcome: Completed/Met Date Met:  09/12/14 Goal: Hemodynamically stable Outcome: Progressing  Problem: Phase II Progression Outcomes Goal: CBGs less than or equal to 200 Outcome: Completed/Met Date Met:  09/12/14 Goal: HHNK- Electrolytes stable Outcome: Progressing Goal: Progress activity as tolerated unless otherwise ordered Outcome: Progressing Goal: Tolerating diet Outcome: Completed/Met Date Met:  09/12/14

## 2014-09-12 NOTE — Plan of Care (Signed)
Problem: Phase II Progression Outcomes Goal: Other Phase II Outcomes/Goals Outcome: Progressing Discussed insulin CBGs and insulin with patient. He needs continued reinforcement.

## 2014-09-12 NOTE — Progress Notes (Signed)
Handoff report called to Lupita Leashonna, Charity fundraiserN.  Transferring via wheelchair to room 1334.  Patient's father at his bedside.

## 2014-09-13 DIAGNOSIS — R739 Hyperglycemia, unspecified: Secondary | ICD-10-CM

## 2014-09-13 DIAGNOSIS — I429 Cardiomyopathy, unspecified: Secondary | ICD-10-CM | POA: Diagnosis present

## 2014-09-13 DIAGNOSIS — I5041 Acute combined systolic (congestive) and diastolic (congestive) heart failure: Secondary | ICD-10-CM | POA: Diagnosis not present

## 2014-09-13 DIAGNOSIS — I110 Hypertensive heart disease with heart failure: Secondary | ICD-10-CM

## 2014-09-13 LAB — BASIC METABOLIC PANEL
ANION GAP: 12 (ref 5–15)
BUN: 28 mg/dL — ABNORMAL HIGH (ref 6–23)
CO2: 26 mEq/L (ref 19–32)
CREATININE: 0.92 mg/dL (ref 0.50–1.35)
Calcium: 9.3 mg/dL (ref 8.4–10.5)
Chloride: 100 mEq/L (ref 96–112)
GFR calc non Af Amer: >90 mL/min (ref >90)
Glucose, Bld: 155 mg/dL — ABNORMAL HIGH (ref 70–99)
POTASSIUM: 3.6 meq/L — AB (ref 3.7–5.3)
Sodium: 138 mEq/L (ref 137–147)

## 2014-09-13 LAB — GLUCOSE, CAPILLARY
GLUCOSE-CAPILLARY: 156 mg/dL — AB (ref 70–99)
GLUCOSE-CAPILLARY: 162 mg/dL — AB (ref 70–99)
GLUCOSE-CAPILLARY: 149 mg/dL — AB (ref 70–99)
Glucose-Capillary: 133 mg/dL — ABNORMAL HIGH (ref 70–99)

## 2014-09-13 MED ORDER — IRBESARTAN 300 MG PO TABS
300.0000 mg | ORAL_TABLET | Freq: Every day | ORAL | Status: DC
  Administered 2014-09-13 – 2014-09-14 (×2): 300 mg via ORAL
  Filled 2014-09-13 (×3): qty 1

## 2014-09-13 MED ORDER — ATORVASTATIN CALCIUM 40 MG PO TABS
40.0000 mg | ORAL_TABLET | Freq: Every day | ORAL | Status: DC
  Filled 2014-09-13: qty 1

## 2014-09-13 MED ORDER — FUROSEMIDE 40 MG PO TABS
40.0000 mg | ORAL_TABLET | Freq: Two times a day (BID) | ORAL | Status: DC
  Administered 2014-09-13 – 2014-09-14 (×3): 40 mg via ORAL
  Filled 2014-09-13 (×6): qty 1

## 2014-09-13 NOTE — Progress Notes (Signed)
PT Cancellation Note  Patient Details Name: Kurt SeminoleRobert A Cummings MRN: [id number] DOB: 12/23/1965   Cancelled Treatment:    Reason Eval/Treat Not Completed: PT screened, no needs identified, will sign off (pt up mobilizing I'ly in room, bending,  turning,etc)   [facility] PavilionWILLIAMS,Raeqwon Lux 09/13/2014, 4:18 PM

## 2014-09-13 NOTE — Plan of Care (Signed)
Problem: Phase II Progression Outcomes Goal: Pain controlled Outcome: Completed/Met Date Met:  09/13/14     

## 2014-09-13 NOTE — Progress Notes (Signed)
Progress Note   Kurt SeminoleRobert A Druck [id number] DOB: 10/04/1966 DOA: 09/10/2014 PCP: No primary care provider on file.   Brief Narrative:   Kurt Cummings is an 48 y.o. male with a PMH of schizophrenia, who was admitted 09/10/2014 with a one-week history of worsening shortness of breath.  On admission, blood pressure was as high as 227/119. Patient was treated with a nitroglycerin drip with improvement in BP. He was also mildly hypoxic with an oxygen saturation of 89% on room air, treated with oxygen via nasal cannula.  Blood work was significant for elevated WBC of 14, normal troponin level, BNP 957, d-dimer slightly elevated at 0.64. The 12 lead EKG showed sinus tachycardia. Urine drug screen was normal. Urinalysis was significant for glucose of 500, no ketones and no leukocytes. CBG was 184 and glucose on BMET was 269.  Patient was admitted to step down unit for management of accelerated hypertension.  Assessment/Plan:   Principal Problem:   Acute respiratory failure with hypoxia secondary to acute systolic and diastolic CHF / dyspnea / chest pain  Patient was admitted and placed on Lasix and nitroglycerin.  Troponins negative 3 sets. Continue aspirin therapy.  Cardiology consultation done by Dr. Rennis GoldenHilty 09/01/14, with recommendations to control blood pressure.  2-D echo done 09/11/14 which showed mildly reduced systolic function with an EF of 45-50 percent and grade 1 diastolic dysfunction.  Weight is down.  Given elevated d-dimer, CT angiogram was done to rule out PE, which was negative.  Will need stress test prior to D/C.  Active Problems:   Hypertensive crisis/emergency  Initially treated with a nitroglycerin drip. Systolic blood pressures in the 150s.  Continue furosemide, Avapro, metoprolol.      New onset type 2 diabetes mellitus  Hemoglobin A1c 10%.  Currently being managed with moderate scale SSI, 20 units of Lantus daily at bedtime and 4 units of meal  coverage 3 times a day. CBGs [id number].   Lipids okay with an LDL of 49.    Hypokalemia   Secondary to diuretics. Continue replacement therapy.    Prolonged QT interval  Monitor on telemetry.    Schizophrenia  Does not appear to be on any psychiatric medications.    Leukocytosis  No evidence of infection, resolved.    Hyponatremia  Likely secondary to CHF physiology.    DVT Prophylaxis  Continue Lovenox.  Code Status: Full. Family Communication: No family at the bedside. Disposition Plan: Home when stable.   IV Access:    Peripheral IV   Procedures and diagnostic studies:   Dg Chest 2 View 09/10/2014 Mild cardiomegaly, interstitial prominence likely represents pulmonary edema with small pleural effusion. Bibasilar atelectasis versus confluent edema, less likely pneumonia.   Ct Angio Chest Pe W/cm &/or Wo Cm 09/11/2014: 1. No evidence acute pulmonary embolism. 2. Bilateral pleural effusions and dense bibasilar passive atelectasis.  2D echocardiogram 09/11/14: Moderate LVH. Mildly reduced systolic function with an EF of 45-50 percent. Anteroseptal and septal hypokinesis. Grade 1 diastolic dysfunction.    Medical Consultants:    Dr. Zoila ShutterKenneth Hilty, Cardiology.  Anti-Infectives:    None.  Subjective:   Kurt SeminoleRobert A Latin tells me his headache is better.  No chest pain.  Dyspnea improved. Seems a bit anxious about the dyspnea returning.  Objective:    Filed Vitals:   09/12/14 2040 09/12/14 2112 09/13/14 0500 09/13/14 0522  BP:  151/81  150/93  Pulse: 100 102  93  Temp:  98.6 F (37 C)  98.2  F (36.8 C)  TempSrc:  Oral  Oral  Resp:  21  18  Height:      Weight:   116.756 kg (257 lb 6.4 oz)   SpO2:  98%  97%    Intake/Output Summary (Last 24 hours) at 09/13/14 0732 Last data filed at 09/12/14 2330  Gross per 24 hour  Intake   1210 ml  Output    750 ml  Net    460 ml    Exam: Gen:  NAD Cardiovascular:  RRR, No M/R/G Respiratory:   Lungs CTAB Gastrointestinal:  Abdomen soft, NT/ND, + BS Extremities:  No C/E/C, hemosiderin deposits, LLE with trace edema   Data Reviewed:    Labs: Basic Metabolic Panel:  Recent Labs Lab 09/10/14 2048 09/11/14 0017 09/11/14 0620 09/12/14 0404 09/13/14 0500  NA 134*  --  131* 135* 138  K 3.8  --  3.2* 3.4* 3.6*  CL 98  --  94* 96 100  CO2 20  --  24 26 26   GLUCOSE 251*  --  269* 194* 155*  BUN 16  --  14 16 28*  CREATININE 0.63 0.61 0.65 0.98 0.92  CALCIUM 9.1  --  8.7 9.1 9.3  MG  --   --  1.7  --   --   PHOS  --   --  2.8  --   --    GFR Estimated Creatinine Clearance: 122.4 mL/min (by C-G formula based on Cr of 0.92). Liver Function Tests:  Recent Labs Lab 09/11/14 0620  AST 32  ALT 68*  ALKPHOS 87  BILITOT 1.0  PROT 6.5  ALBUMIN 3.5    CBC:  Recent Labs Lab 09/10/14 2048 09/11/14 0017 09/11/14 0620  WBC 14.8* 12.3* 9.7  HGB 14.1 12.8* 12.2*  HCT 39.0 36.8* 35.2*  MCV 77.4* 77.8* 78.2  PLT 204 220 212   Cardiac Enzymes:  Recent Labs Lab 09/11/14 0017 09/11/14 0620 09/11/14 1217  TROPONINI <0.30 <0.30 <0.30   BNP (last 3 results)  Recent Labs  09/10/14 2047  PROBNP 957.1*   CBG:  Recent Labs Lab 09/11/14 2144 09/12/14 0751 09/12/14 1257 09/12/14 1637 09/12/14 2213  GLUCAP 223* 197* 167* 157* 217*   D-Dimer:  Recent Labs  09/10/14 2351  DDIMER 0.64*   Hgb A1c:  Recent Labs  09/11/14 0017  HGBA1C 10.0*   Lipid Profile:  Recent Labs  09/12/14 0350  CHOL 117  HDL 35*  LDLCALC 49  TRIG [id number]*  CHOLHDL 3.3   Thyroid function studies:  Recent Labs  09/11/14 0620  TSH 3.590   Microbiology Recent Results (from the past 240 hour(s))  MRSA PCR Screening     Status: None   Collection Time: 09/11/14 12:07 AM  Result Value Ref Range Status   MRSA by PCR NEGATIVE NEGATIVE Final    Comment:        The GeneXpert MRSA Assay (FDA approved for NASAL specimens only), is one component of a comprehensive MRSA  colonization surveillance program. It is not intended to diagnose MRSA infection nor to guide or monitor treatment for MRSA infections.      Medications:   . aspirin EC  81 mg Oral Daily  . docusate sodium  100 mg Oral BID  . enoxaparin (LOVENOX) injection  60 mg Subcutaneous Daily  . furosemide  40 mg Intravenous Q12H  . Influenza vac split quadrivalent PF  0.5 mL Intramuscular Tomorrow-1000  . insulin aspart  0-15 Units Subcutaneous TID WC  .  insulin aspart  0-5 Units Subcutaneous QHS  . irbesartan  150 mg Oral Daily  . metoprolol tartrate  25 mg Oral BID  . pneumococcal 23 valent vaccine  0.5 mL Intramuscular Tomorrow-1000  . potassium chloride  20 mEq Oral BID  . sodium chloride  3 mL Intravenous Q12H   Continuous Infusions: . nitroGLYCERIN Stopped (09/11/14 1800)    Time spent: 35 minutes with > 50% of time discussing current diagnostic test results, clinical impression and plan of care.    LOS: 3 days   Mical Kicklighter  [facility] Pager [phone]. If unable to reach me by pager, please call my cell phone at [phone].  *Please refer to [email or web address], password [email or web address] to get updated schedule on who will round on this patient, as hospitalists switch teams weekly. If 7PM-7AM, please contact night-coverage at [email or web address], password [email or web address] for any overnight needs.  09/13/2014, 7:32 AM    Information printed out and reviewed with the patient/family:     In an effort to keep you and your family informed about your hospital stay, I am providing you with this information sheet. If you or your family have any questions, please do not hesitate to have the nursing staff page me to set up a meeting time.  Also note that the hospitalist doctors typically change on Tuesdays or Wednesdays to a different hospitalist doctor.  YAMAN GRAUBERGER 09/13/2014 3 (Number of days in the hospital)  Treatment team:  Dr. Hillery Aldo, Hospitalist (Internist)  Dr. Rachelle Hora Croitoru and  Dr. Verne Carrow (Heart doctors)  Pertinent labs / studies: 1. Chest x-ray done 09/10/14 showed mild enlargement of your heart, and mild fluid in the lungs. 2. CT of the chest done 09/11/14 was negative for blood clots. 3. Echocardiogram done 09/11/14 showed mild enlargement of your heart with mild weakness of the heart muscle and mild stiffness of the heart muscle. 4. Blood tests show that you have new onset diabetes.  Principle Diagnosis:  Severely high blood pressure causing mild heart failure, new onset diabetes   Plan for today: 1. Adjust blood pressure medications to achieve better blood pressure control. 2. Heart failure improved with diuretic medications, which will be changed to pill form today. 3. A stress test is planned for tomorrow. Do not eat or drink anything past midnight. 4. The dietitians will come and speak with you about a low salt, diabetic diet.   Anticipated discharge date: 09/14/14

## 2014-09-13 NOTE — Plan of Care (Signed)
Problem: Phase III Progression Outcomes Goal: Fluid volume status improved Outcome: Completed/Met Date Met:  09/13/14

## 2014-09-13 NOTE — Plan of Care (Signed)
Problem: Phase II Progression Outcomes Goal: Tolerating diet Outcome: Completed/Met Date Met:  09/13/14

## 2014-09-13 NOTE — Plan of Care (Signed)
Problem: Phase I Progression Outcomes Goal: Hemodynamically stable Outcome: Progressing     

## 2014-09-13 NOTE — Plan of Care (Signed)
Problem: Phase I Progression Outcomes Goal: EF % per last Echo/documented,Core Reminder form on chart Outcome: Completed/Met Date Met:  09/13/14     

## 2014-09-13 NOTE — Plan of Care (Signed)
Problem: Phase III Progression Outcomes Goal: Activity at appropriate level-compared to baseline (UP IN CHAIR FOR HEMODIALYSIS)  Outcome: Completed/Met Date Met:  09/13/14 Goal: Tolerating diet Outcome: Completed/Met Date Met:  09/13/14

## 2014-09-13 NOTE — Plan of Care (Signed)
Problem: Phase I Progression Outcomes Goal: Diabetes Coordinator Consult Outcome: Completed/Met Date Met:  09/13/14

## 2014-09-13 NOTE — Plan of Care (Signed)
Problem: Phase I Progression Outcomes Goal: Up in chair, BRP Outcome: Completed/Met Date Met:  09/13/14

## 2014-09-13 NOTE — Plan of Care (Signed)
Problem: Phase II Progression Outcomes Goal: Dyspnea controlled with activity Outcome: Completed/Met Date Met:  09/13/14

## 2014-09-13 NOTE — Progress Notes (Signed)
Patient Name: Kurt SeminoleRobert A Cummings Date of Encounter: 09/13/2014  Principal Problem:   Acute respiratory failure with hypoxia Active Problems:   Dyspnea   Hypertensive crisis   New onset type 2 diabetes mellitus   Hypertensive emergency   Prolonged QT interval   Schizophrenia   Leukocytosis   Chest pain   Hypokalemia   Acute systolic congestive heart failure   Length of Stay: 3  SUBJECTIVE  Breathing greatly improved, despite no net diuresis in last 24 hours. Echo showed EF 45% with suggestion of regional abnormalities. BP improved, still high. Note new diagnoses of type 2 DM and mild hyperlipidemia, but low calcium burden in coronary tree on CTA chest.  CURRENT MEDS . aspirin EC  81 mg Oral Daily  . docusate sodium  100 mg Oral BID  . enoxaparin (LOVENOX) injection  60 mg Subcutaneous Daily  . furosemide  40 mg Oral BID  . Influenza vac split quadrivalent PF  0.5 mL Intramuscular Tomorrow-1000  . insulin aspart  0-15 Units Subcutaneous TID WC  . insulin aspart  0-5 Units Subcutaneous QHS  . irbesartan  300 mg Oral Daily  . metoprolol tartrate  25 mg Oral BID  . pneumococcal 23 valent vaccine  0.5 mL Intramuscular Tomorrow-1000  . potassium chloride  20 mEq Oral BID  . sodium chloride  3 mL Intravenous Q12H    OBJECTIVE   Intake/Output Summary (Last 24 hours) at 09/13/14 0908 Last data filed at 09/13/14 0830  Gross per 24 hour  Intake   1025 ml  Output      0 ml  Net   1025 ml   Filed Weights   09/11/14 0406 09/12/14 0358 09/13/14 0500  Weight: 272 lb 11.3 oz (123.7 kg) 262 lb 5.6 oz (119 kg) 257 lb 6.4 oz (116.756 kg)    PHYSICAL EXAM Filed Vitals:   09/12/14 2040 09/12/14 2112 09/13/14 0500 09/13/14 0522  BP:  151/81  150/93  Pulse: 100 102  93  Temp:  98.6 F (37 C)  98.2 F (36.8 C)  TempSrc:  Oral  Oral  Resp:  21  18  Height:      Weight:   257 lb 6.4 oz (116.756 kg)   SpO2:  98%  97%   General: Alert, oriented x3, no distress Head: no  evidence of trauma, PERRL, EOMI, no exophtalmos or lid lag, no myxedema, no xanthelasma; normal ears, nose and oropharynx Neck: normal jugular venous pulsations and no hepatojugular reflux; brisk carotid pulses without delay and no carotid bruits Chest: clear to auscultation, no signs of consolidation by percussion or palpation, normal fremitus, symmetrical and full respiratory excursions Cardiovascular: normal position and quality of the apical impulse, regular rhythm, normal first and second heart sounds, no rubs or gallops, no murmur Abdomen: no tenderness or distention, no masses by palpation, no abnormal pulsatility or arterial bruits, normal bowel sounds, no hepatosplenomegaly Extremities: no clubbing, cyanosis or edema; 2+ radial, ulnar and brachial pulses bilaterally; 2+ right femoral, posterior tibial and dorsalis pedis pulses; 2+ left femoral, posterior tibial and dorsalis pedis pulses; no subclavian or femoral bruits Neurological: grossly nonfocal  LABS  CBC  Recent Labs  09/11/14 0017 09/11/14 0620  WBC 12.3* 9.7  HGB 12.8* 12.2*  HCT 36.8* 35.2*  MCV 77.8* 78.2  PLT 220 212   Basic Metabolic Panel  Recent Labs  09/11/14 0620 09/12/14 0404 09/13/14 0500  NA 131* 135* 138  K 3.2* 3.4* 3.6*  CL 94* 96 100  CO2  24 26 26   GLUCOSE 269* 194* 155*  BUN 14 16 28*  CREATININE 0.65 0.98 0.92  CALCIUM 8.7 9.1 9.3  MG 1.7  --   --   PHOS 2.8  --   --    Liver Function Tests  Recent Labs  09/11/14 0620  AST 32  ALT 68*  ALKPHOS 87  BILITOT 1.0  PROT 6.5  ALBUMIN 3.5   No results for input(s): LIPASE, AMYLASE in the last 72 hours. Cardiac Enzymes  Recent Labs  09/11/14 0017 09/11/14 0620 09/11/14 1217  TROPONINI <0.30 <0.30 <0.30   BNP Invalid input(s): POCBNP D-Dimer  Recent Labs  09/10/14 2351  DDIMER 0.64*   Hemoglobin A1C  Recent Labs  09/11/14 0017  HGBA1C 10.0*   Fasting Lipid Panel  Recent Labs  09/12/14 0350  CHOL 117  HDL 35*   LDLCALC 49  TRIG 165*  CHOLHDL 3.3   Thyroid Function Tests  Recent Labs  09/11/14 0620  TSH 3.590    Radiology Studies Imaging results have been reviewed and Ct Angio Chest Pe W/cm &/or Wo Cm  09/11/2014   CLINICAL DATA:  Short of breath.  EXAM: CT ANGIOGRAPHY CHEST WITH CONTRAST  TECHNIQUE: Multidetector CT imaging of the chest was performed using the standard protocol during bolus administration of intravenous contrast. Multiplanar CT image reconstructions and MIPs were obtained to evaluate the vascular anatomy.  CONTRAST:  100mL OMNIPAQUE IOHEXOL 350 MG/ML SOLN  COMPARISON:  Radiograph 09/10/2014  FINDINGS: There are no filling defects within the pulmonary arteries to suggest acute pulmonary embolism. No acute findings of the aorta or great vessels. Esophagus is normal.  Review of the lung parenchyma demonstrates bilateral pleural effusions and dense bibasilar passive atelectasis. Fine ground-glass opacity in the left upper lobe suggesting mild edema.  No axillary supraclavicular lymphadenopathy. No mediastinal hilar lymphadenopathy.  Limited upper abdomen is unremarkable. Limited view of the skeleton unremarkable.  Review of the MIP images confirms the above findings.  IMPRESSION: 1. No evidence acute pulmonary embolism. 2. Bilateral pleural effusions and dense bibasilar passive atelectasis   Electronically Signed   By: Genevive BiStewart  Edmunds M.D.   On: 09/11/2014 13:56    TELE NSr  ECG ST, LVH with slight QRS broadening and repol changes  ASSESSMENT AND PLAN Mild cardiomyopathy may be entirely hypertension-related, but needs at least a nuclear scan to exclude CAD. Lexiscan myoview tomorrow AM. Acute diastolic heart failure is markedly improved and he probably does not need additional diuresis. Change to PO diuretics. Increase ARB for malignant HTN. Mild hypertriglyceridemia does not need specific pharmacological therapy, just good glycemic control. Discussed dietary sodium restriction  and daily weight monitoring. Increase daily exercise (he is avoiding walking due to ankle injury), maybe stationary bike. May benefit from dietitian counseling for low Na diabetic diet.   Thurmon FairMihai Nakshatra Klose, MD, [facility]FACC [facility] [phone] office [phone] pager 09/13/2014 9:08 AM

## 2014-09-13 NOTE — Plan of Care (Signed)
Problem: Phase I Progression Outcomes Goal: Pain controlled with appropriate interventions Outcome: Completed/Met Date Met:  09/13/14     

## 2014-09-14 ENCOUNTER — Inpatient Hospital Stay (HOSPITAL_COMMUNITY): Payer: Medicare Other

## 2014-09-14 ENCOUNTER — Other Ambulatory Visit (HOSPITAL_COMMUNITY): Payer: Federal, State, Local not specified - PPO

## 2014-09-14 DIAGNOSIS — I429 Cardiomyopathy, unspecified: Secondary | ICD-10-CM

## 2014-09-14 DIAGNOSIS — I160 Hypertensive urgency: Secondary | ICD-10-CM | POA: Insufficient documentation

## 2014-09-14 DIAGNOSIS — I4581 Long QT syndrome: Secondary | ICD-10-CM

## 2014-09-14 LAB — BASIC METABOLIC PANEL
Anion gap: 15 (ref 5–15)
BUN: 25 mg/dL — ABNORMAL HIGH (ref 6–23)
CALCIUM: 9.5 mg/dL (ref 8.4–10.5)
CO2: 24 mEq/L (ref 19–32)
Chloride: 97 mEq/L (ref 96–112)
Creatinine, Ser: 0.88 mg/dL (ref 0.50–1.35)
GLUCOSE: 140 mg/dL — AB (ref 70–99)
POTASSIUM: 3.6 meq/L — AB (ref 3.7–5.3)
SODIUM: 136 meq/L — AB (ref 137–147)

## 2014-09-14 LAB — GLUCOSE, CAPILLARY
GLUCOSE-CAPILLARY: 164 mg/dL — AB (ref 70–99)
GLUCOSE-CAPILLARY: 160 mg/dL — AB (ref 70–99)
Glucose-Capillary: 149 mg/dL — ABNORMAL HIGH (ref 70–99)
Glucose-Capillary: 141 mg/dL — ABNORMAL HIGH (ref 70–99)

## 2014-09-14 MED ORDER — TECHNETIUM TC 99M SESTAMIBI GENERIC - CARDIOLITE
30.0000 | Freq: Once | INTRAVENOUS | Status: AC | PRN
  Administered 2014-09-14: 30 via INTRAVENOUS

## 2014-09-14 MED ORDER — REGADENOSON 0.4 MG/5ML IV SOLN
0.4000 mg | Freq: Once | INTRAVENOUS | Status: AC
  Administered 2014-09-14: 0.4 mg via INTRAVENOUS

## 2014-09-14 MED ORDER — POTASSIUM CHLORIDE CRYS ER 20 MEQ PO TBCR
30.0000 meq | EXTENDED_RELEASE_TABLET | Freq: Two times a day (BID) | ORAL | Status: DC
  Administered 2014-09-14 – 2014-09-16 (×4): 30 meq via ORAL
  Filled 2014-09-14 (×7): qty 1

## 2014-09-14 MED ORDER — SODIUM CHLORIDE 0.9 % IV SOLN
Freq: Once | INTRAVENOUS | Status: AC
  Administered 2014-09-15: 04:00:00 via INTRAVENOUS

## 2014-09-14 MED ORDER — TECHNETIUM TC 99M SESTAMIBI GENERIC - CARDIOLITE
10.0000 | Freq: Once | INTRAVENOUS | Status: AC | PRN
  Administered 2014-09-14: 10 via INTRAVENOUS

## 2014-09-14 NOTE — Plan of Care (Signed)
Problem: Food- and Nutrition-Related Knowledge Deficit (NB-1.1) Goal: Nutrition education Formal process to instruct or train a patient/client in a skill or to impart knowledge to help patients/clients voluntarily manage or modify food choices and eating behavior to maintain or improve health. Outcome: Completed/Met Date Met:  09/14/14  RD consulted for nutrition education regarding diabetes and low sodium diet.     Lab Results  Component Value Date    HGBA1C 10.0* 09/11/2014    RD provided "Carbohydrate Counting for People with Diabetes" handout from the Academy of Nutrition and Dietetics and "Plate Method" visual handout. Discussed different food groups and their effects on blood sugar, emphasizing carbohydrate-containing foods. Provided list of carbohydrates and recommended serving sizes of common foods.  RD provided "Low Sodium Nutrition Therapy" handout from the Academy of Nutrition and Dietetics. Reviewed patient's dietary recall. Provided examples on ways to decrease sodium intake in diet. Discouraged intake of processed foods and use of salt shaker. Encouraged fresh fruits and vegetables as well as whole grain sources of carbohydrates to maximize fiber intake.   Discussed importance of controlled and consistent carbohydrate intake throughout the day. Provided examples of ways to balance meals/snacks and encouraged intake of high-fiber, whole grain complex carbohydrates. Teach back method used.  Expect fair compliance with pt following up with outpatient diabetes educator/RD. Pt with many inaccurate ideas of what is considered a carbohydrate food.  Body mass index is 38.2 kg/(m^2). Pt meets criteria for obesity based on current BMI.  Current diet order is CHO modified, patient is consuming approximately 100% of meals at this time. Labs and medications reviewed. No further nutrition interventions warranted at this time. RD contact information provided. If additional nutrition issues  arise, please re-consult RD.  Kurt Bibles, MS, RD, LDN Pager: [phone] After Hours Pager: [phone]

## 2014-09-14 NOTE — Progress Notes (Signed)
Call the patient, discussed with benefit and risk of cardiac catheterization. Risk include bleeding, vascular/renal injury, arrhythmia, MI, stroke, loss of limb or life. He displayed clear understanding and agree of proceed.   I have informed of patient's nurse who will arrange transport tomorrow and find out who will do the procedure.  Ramond DialSigned, Alexys Lobello PA Pager: [phone]

## 2014-09-14 NOTE — Progress Notes (Signed)
Progress Note   Kurt Cummings:[id number]:[id number] DOB: 1965-11-07 DOA: 09/10/2014 PCP: No primary care provider on file.   Brief Narrative:   Kurt Cummings is an 48 y.o. male with a PMH of schizophrenia, who was admitted 09/10/2014 with a one-week history of worsening shortness of breath.  On admission, blood pressure was as high as 227/119. Patient was treated with a nitroglycerin drip with improvement in BP. He was also mildly hypoxic with an oxygen saturation of 89% on room air, treated with oxygen via nasal cannula.  Blood work was significant for elevated WBC of 14, normal troponin level, BNP 957, d-dimer slightly elevated at 0.64. The 12 lead EKG showed sinus tachycardia. Urine drug screen was normal. Urinalysis was significant for glucose of 500, no ketones and no leukocytes. CBG was 184 and glucose on BMET was 269.  Patient was admitted to step down unit for management of accelerated hypertension.  Assessment/Plan:   Principal Problem:   Acute respiratory failure with hypoxia secondary to acute systolic and diastolic CHF / dyspnea / chest pain  Patient was admitted and placed on Lasix and nitroglycerin with subsequent stabilization of BP/respiratory status.  Troponins negative 3 sets. Continue aspirin therapy.  2-D echo done 09/11/14 which showed mildly reduced systolic function with an EF of 45-50 percent and grade 1 diastolic dysfunction.  Given elevated d-dimer, CT angiogram was done to rule out PE, which was negative.  Cardiology on board.  Stress test abnormal, for cardiac catheterization 09/15/14.  Active Problems:   Hypertensive crisis/emergency  Initially treated with a nitroglycerin drip. Systolic blood pressures in the 150s.  Continue furosemide, Avapro, metoprolol.      New onset type 2 diabetes mellitus  Hemoglobin A1c 10%.  Currently being managed with moderate scale SSI, 20 units of Lantus daily at bedtime and 4 units of meal coverage 3 times a  day. CBGs 133-164.   Lipids okay with an LDL of 49.    Hypokalemia   Secondary to diuretics. Continue replacement therapy.    Prolonged QT interval  Monitored on telemetry.    Schizophrenia  Does not appear to be on any psychiatric medications.    Leukocytosis  No evidence of infection, resolved.    Hyponatremia  Likely secondary to CHF physiology.    DVT Prophylaxis  Continue Lovenox.  Code Status: Full. Family Communication: No family at the bedside. Disposition Plan: Home when stable.   IV Access:    Peripheral IV   Procedures and diagnostic studies:   Dg Chest 2 View 09/10/2014 Mild cardiomegaly, interstitial prominence likely represents pulmonary edema with small pleural effusion. Bibasilar atelectasis versus confluent edema, less likely pneumonia.   Ct Angio Chest Pe W/cm &/or Wo Cm 09/11/2014: 1. No evidence acute pulmonary embolism. 2. Bilateral pleural effusions and dense bibasilar passive atelectasis.  2D echocardiogram 09/11/14: Moderate LVH. Mildly reduced systolic function with an EF of 45-50 percent. Anteroseptal and septal hypokinesis. Grade 1 diastolic dysfunction.  Nm Myocar Multi W/spect W/wall Motion / Ef 09/14/2014: 1. Inferior and inferolateral scar without evidence of inducible myocardial ischemia.  2. Global hypokinesis with basal septal akinesis/ mild dyskinesis.  3. Left ventricular ejection fraction 36% with moderate LV cavity dilatation.  4.  Intermediate to high-risk stress test findings*.  *2012 Appropriate Use Criteria for Coronary Revascularization Focused Update: J Am Coll Cardiol. 2012;59(9):857-881. http://content.dementiazones.comonlinejacc.org/article.aspx?articleid=1201161     Medical Consultants:    Dr. Zoila ShutterKenneth Hilty, Cardiology.  Anti-Infectives:    None.  Subjective:   [name]  A Kurt Cummings is without complaints of dyspnea, chest pain, nausea.  Objective:    Filed Vitals:   09/14/14 0948 09/14/14 0950 09/14/14 0952 09/14/14  1435  BP: 188/84 184/86 172/92 158/93  Pulse: 109 106 100 78  Temp:    97.9 F (36.6 C)  TempSrc:    Oral  Resp:    18  Height:      Weight:      SpO2:    100%    Intake/Output Summary (Last 24 hours) at 09/14/14 1511 Last data filed at 09/14/14 1230  Gross per 24 hour  Intake    360 ml  Output      0 ml  Net    360 ml    Exam: Gen:  NAD Cardiovascular:  RRR, No M/R/G Respiratory:  Lungs CTAB Gastrointestinal:  Abdomen soft, NT/ND, + BS Extremities:  No C/E/C, hemosiderin deposits, LLE with trace edema   Data Reviewed:    Labs: Basic Metabolic Panel:  Recent Labs Lab 09/10/14 2048 09/11/14 0017 09/11/14 0620 09/12/14 0404 09/13/14 0500 09/14/14 0500  NA 134*  --  131* 135* 138 136*  K 3.8  --  3.2* 3.4* 3.6* 3.6*  CL 98  --  94* 96 100 97  CO2 20  --  24 26 26 24   GLUCOSE 251*  --  269* 194* 155* 140*  BUN 16  --  14 16 28* 25*  CREATININE 0.63 0.61 0.65 0.98 0.92 0.88  CALCIUM 9.1  --  8.7 9.1 9.3 9.5  MG  --   --  1.7  --   --   --   PHOS  --   --  2.8  --   --   --    GFR Estimated Creatinine Clearance: 128.4 mL/min (by C-G formula based on Cr of 0.88). Liver Function Tests:  Recent Labs Lab 09/11/14 0620  AST 32  ALT 68*  ALKPHOS 87  BILITOT 1.0  PROT 6.5  ALBUMIN 3.5    CBC:  Recent Labs Lab 09/10/14 2048 09/11/14 0017 09/11/14 0620  WBC 14.8* 12.3* 9.7  HGB 14.1 12.8* 12.2*  HCT 39.0 36.8* 35.2*  MCV 77.4* 77.8* 78.2  PLT 204 220 212   Cardiac Enzymes:  Recent Labs Lab 09/11/14 0017 09/11/14 0620 09/11/14 1217  TROPONINI <0.30 <0.30 <0.30   BNP (last 3 results)  Recent Labs  09/10/14 2047  PROBNP 957.1*   CBG:  Recent Labs Lab 09/13/14 1153 09/13/14 1718 09/13/14 2245 09/14/14 0750 09/14/14 1142  GLUCAP 162* 149* 133* 149* 164*   Lipid Profile:  Recent Labs  09/12/14 0350  CHOL 117  HDL 35*  LDLCALC 49  TRIG 161165*  CHOLHDL 3.3    Microbiology Recent Results (from the past 240 hour(s))  MRSA  PCR Screening     Status: None   Collection Time: 09/11/14 12:07 AM  Result Value Ref Range Status   MRSA by PCR NEGATIVE NEGATIVE Final    Comment:        The GeneXpert MRSA Assay (FDA approved for NASAL specimens only), is one component of a comprehensive MRSA colonization surveillance program. It is not intended to diagnose MRSA infection nor to guide or monitor treatment for MRSA infections.      Medications:   . aspirin EC  81 mg Oral Daily  . docusate sodium  100 mg Oral BID  . enoxaparin (LOVENOX) injection  60 mg Subcutaneous Daily  . furosemide  40 mg Oral BID  . insulin  aspart  0-15 Units Subcutaneous TID WC  . insulin aspart  0-5 Units Subcutaneous QHS  . irbesartan  300 mg Oral Daily  . metoprolol tartrate  25 mg Oral BID  . potassium chloride  20 mEq Oral BID  . sodium chloride  3 mL Intravenous Q12H   Continuous Infusions:    Time spent: 35 minutes with > 50% of time discussing current diagnostic test results, clinical impression and plan of care.    LOS: 4 days   Dajohn Ellender  [facility] Pager [phone]. If unable to reach me by pager, please call my cell phone at [phone].  *Please refer to [email or web address], password [email or web address] to get updated schedule on who will round on this patient, as hospitalists switch teams weekly. If 7PM-7AM, please contact night-coverage at [email or web address], password [email or web address] for any overnight needs.  09/14/2014, 3:11 PM

## 2014-09-14 NOTE — Plan of Care (Signed)
Problem: Phase I Progression Outcomes Goal: Initial discharge plan identified Outcome: Completed/Met Date Met:  09/14/14  Problem: Phase II Progression Outcomes Goal: Case manager referral Outcome: Completed/Met Date Met:  09/14/14

## 2014-09-14 NOTE — Progress Notes (Signed)
Stress trest result noted below:  IMPRESSION: 1. Inferior and inferolateral scar without evidence of inducible myocardial ischemia.  2. Global hypokinesis with basal septal akinesis/ mild dyskinesis.  3. Left ventricular ejection fraction 36% with moderate LV cavity dilatation.  4. Intermediate to high-risk stress test findings*.  Discussed with Dr. Royann Shiversroitoru, cath tomorrow, NPO past midnight.  Ramond DialSigned, Elyna Pangilinan PA Pager: [phone]

## 2014-09-14 NOTE — Progress Notes (Signed)
Lexiscan stress test completed without complication. Did have some worsening TWI in lateral leads with lexiscan. Pending final result.  Of note, patient's baseline SBP in 180s, eventually went down to 170s. Both ARB and BB held this morning, will need to restart as soon as get back.  Ramond DialSigned, Rochel Privett PA Pager: [phone]

## 2014-09-14 NOTE — Progress Notes (Addendum)
CARE MANAGEMENT NOTE 09/14/2014  Patient:  Wiler,Julie A   Account Number:  [id number]  Date Initiated:  09/11/2014  Documentation initiated by:  DAVIS,RHONDA  Subjective/Objective Assessment:   hypertensive crisis requiring iv ntg drp     Action/Plan:   home when stable   Anticipated DC Date:  09/15/2014   Anticipated DC Plan:  HOME W HOME HEALTH SERVICES  In-house referral  NA      DC Planning Services  CM consult      [facility]  [facility]   Choice offered to / List presented to:  C-1 Patient      DME agency  NA     HH arranged  HH-1 RN      [facility]  [facility]   Status of service:  Completed, signed off Medicare Important Message given?   (If response is "NO", the following Medicare IM given date fields will be blank) Date Medicare IM given:   Medicare IM given by:   Date Additional Medicare IM given:   Additional Medicare IM given by:    Discharge Disposition:  HOME W HOME HEALTH SERVICES  Per UR Regulation:  Reviewed for med. necessity/level of care/duration of stay  If discussed at Long Length of Stay Meetings, dates discussed:    Comments:  09/14/2014 1420 [facility] Provided pt with address to [facility]. Isidoro DonningAlesia Arshiya Jakes RN CCM Case Mgmt phone [phone]  09/14/2014 1230 NCM spoke to pt and states he lives at home alone. He drives and is able to get out to his appts. Pt states his Med A&B is primary insurance. Explained to pt BCBS showing primary. Pt states he does not have a PCP at this time. He prefers to go to classes to learn about diabetes. [facility] educ in the home also. States he does not want HH RN at this time. Waiting final dc orders. Pt will [facility] final decision [facility] prior to dc. Diabetes Coordinator arranged with [facility] for classes. Isidoro DonningAlesia Earnstine Meinders RN CM Case Mgmt phone [phone]  [id number]/Rhonda Earlene Plateravis, RN, BSN, CCM Chart  reviewed. Discharge needs and patient's stay to be reviewed and followed by case manager. Chart note for progression of stay: On admission, blood pressure was as high as 227/119. With the nitroglycerin drip it trended down to 150/114. HR was 93 - 117, oxygen saturation was 89% on room air. With nasal cannula oxygen support it has improved to 99%. Blood work was significant for elevated white blood cell count is 14, normal troponin level, BNP 957, d-dimer slightly elevated at 0.64. The 12 lead EKG showed sinus tachycardia. Urine drug screen was normal. Urinalysis was significant for glucose of 500, no ketones and no leukocytes. CBG was 184 and glucose on BMET was 269.   Patient was admitted to step down unit for management of accelerated hypertension. Patient is on nitroglycerin drip. Cardiology will see the patient in consultation.   Assessment/Plan:   Principal problem Acute respiratory failure with hypoxia / acute diastolic CHF exacerbation - Patient reports no previous history of CHF. BNP on the admission was 957. - Patient was given Lasix on the admission, we will continue current lasix dose 40 mg IV twice daily. - Chest x-ray showed mild cardiomegaly, interstitial prominence likely representing pulmonary edema with small pleural effusion. - Troponin level 2 negative. The 12-lead EKG on the admission showed sinus tachycardia.   -  Weight on the admission was 117.9 kg. This morning, recorded weight is 123.7 kg. Only trace LE edema on physical exam. Continue daily weight and strict intake and output. Since admission, negative 2.1 L fluid balance. - Replete electrolytes as needed. Potassium was 3.2 this morning and was supplemented with potassium chloride 40 mEq by mouth. - Obtain 2-D echo. - May continue aspirin daily. - Appreciate cardiology consult and their recommendation.

## 2014-09-14 NOTE — Progress Notes (Signed)
Inpatient Diabetes Program Recommendations  AACE/ADA: New Consensus Statement on Inpatient Glycemic Control (2013)  Target Ranges:  Prepandial:   less than 140 mg/dL      Peak postprandial:   less than 180 mg/dL (1-2 hours)      Critically ill patients:  140 - 180 mg/dL    Results for [name], Kurt A (MRN [id number]) as of 09/14/2014 13:32  Ref. Range 09/13/2014 07:54 09/13/2014 11:53 09/13/2014 17:18 09/13/2014 22:45  Glucose-Capillary Latest Range: 70-99 mg/dL 409156 (H) 811162 (H) 914149 (H) 133 (H)    Results for [name], Kurt A (MRN [id number]) as of 09/14/2014 13:32  Ref. Range 09/11/2014 00:17  Hgb A1c MFr Bld Latest Range: <5.7 % 10.0 (H)     **Note patient with new diagnosis of DM.  DM Coordinator spoke with patient on 11/20.  DM Education was started on 11/20 and has been ongoing since admission.  **Note RNs have been working with patient on teaching insulin administration.  DM educational materials have been given to patient.  **Note patient is requesting to go to Outpatient DM Education follow-up after d/c.  Order has been placed and patient has already been scheduled for DM classes at the [facility].  See AVS for dates and times that have been scheduled.    Will follow Ambrose FinlandJeannine Johnston Lindi Abram RN, MSN, CDE Diabetes Coordinator Inpatient Diabetes Program Team Pager: [phone] (8a-10p)

## 2014-09-14 NOTE — Progress Notes (Signed)
Patient Name: Kurt SeminoleRobert A Cummings Date of Encounter: 09/14/2014     Principal Problem:   Acute respiratory failure with hypoxia Active Problems:   Dyspnea   Hypertensive crisis   New onset type 2 diabetes mellitus   Hypertensive emergency   Prolonged QT interval   Schizophrenia   Leukocytosis   Chest pain   Hypokalemia   Acute systolic congestive heart failure   Cardiomyopathy   Hyperglycemia    SUBJECTIVE  Patient seen during lexiscan stress test. Denies CP or SOB.  CURRENT MEDS . aspirin EC  81 mg Oral Daily  . docusate sodium  100 mg Oral BID  . enoxaparin (LOVENOX) injection  60 mg Subcutaneous Daily  . furosemide  40 mg Oral BID  . insulin aspart  0-15 Units Subcutaneous TID WC  . insulin aspart  0-5 Units Subcutaneous QHS  . irbesartan  300 mg Oral Daily  . metoprolol tartrate  25 mg Oral BID  . potassium chloride  20 mEq Oral BID  . sodium chloride  3 mL Intravenous Q12H    OBJECTIVE  Filed Vitals:   09/13/14 2128 09/14/14 0500 09/14/14 0559 09/14/14 0926  BP: 153/87  140/77 184/114  Pulse: 87  88 86  Temp: 97.8 F (36.6 C)  98.2 F (36.8 C)   TempSrc: Oral  Oral   Resp: 20  18 20   Height:      Weight:  258 lb 12.8 oz (117.391 kg)    SpO2: 99%  96%     Intake/Output Summary (Last 24 hours) at 09/14/14 0931 Last data filed at 09/13/14 1230  Gross per 24 hour  Intake    360 ml  Output      0 ml  Net    360 ml   Filed Weights   09/12/14 0358 09/13/14 0500 09/14/14 0500  Weight: 262 lb 5.6 oz (119 kg) 257 lb 6.4 oz (116.756 kg) 258 lb 12.8 oz (117.391 kg)    PHYSICAL EXAM  General: Pleasant, NAD. Neuro: Alert and oriented X 3. Moves all extremities spontaneously. Psych: Normal affect. HEENT:  Normal  Neck: Supple without bruits. Mild JVD Lungs:  Resp regular and unlabored, CTA. Heart: RRR no s3, s4, or murmurs. Abdomen: Soft, non-tender, non-distended, BS + x 4.  Extremities: No clubbing, cyanosis or edema. DP/PT/Radials 2+ and equal  bilaterally.  Accessory Clinical Findings  Basic Metabolic Panel  Recent Labs  09/13/14 0500 09/14/14 0500  NA 138 136*  K 3.6* 3.6*  CL 100 97  CO2 26 24  GLUCOSE 155* 140*  BUN 28* 25*  CREATININE 0.92 0.88  CALCIUM 9.3 9.5   Cardiac Enzymes  Recent Labs  09/11/14 1217  TROPONINI <0.30   Fasting Lipid Panel  Recent Labs  09/12/14 0350  CHOL 117  HDL 35*  LDLCALC 49  TRIG 098165*  CHOLHDL 3.3     ECG  No new EKG  Echocardiogram 09/11/2014  LV EF: 45% -  50%  ------------------------------------------------------------------- Indications:   CHF - 428.0.  ------------------------------------------------------------------- History:  PMH:  Dyspnea. Risk factors: Hypertension. Diabetes mellitus.  ------------------------------------------------------------------- Study Conclusions  - Left ventricle: The cavity size was normal. Wall thickness was increased in a pattern of moderate LVH. Systolic function was mildly reduced. The estimated ejection fraction was in the range of 45% to 50%. Anteroseptal and septal hypokinesis. Doppler parameters are consistent with abnormal left ventricular relaxation (grade 1 diastolic dysfunction). The E/e&' ratio is >8, suggesting indeterminate LV filling pressure. - Left atrium: The atrium  was mildly dilated. - Right atrium: The atrium was mildly dilated.  Impressions:  - LVEF 45-50%, moderate LVH, anteroseptal and septal hypokinesis, diastolic dysfunction, borderline elevated LV filling pressure.    Radiology/Studies  Dg Chest 2 View  09/10/2014   CLINICAL DATA:  Cough for 1 week. Shortness of breath at night when lying down.  EXAM: CHEST  2 VIEW  COMPARISON:  None.  FINDINGS: The cardiac silhouette appears mildly enlarged. Mediastinal silhouette is nonsuspicious. Central pulmonary vascular congestion and interstitial prominence. Small pleural effusion, likely on the LEFT and seen only on  lateral. Bibasilar strandy densities. Mildly elevated RIGHT hemidiaphragm. No pneumothorax. Soft tissue planes and included osseous structures unremarkable.  IMPRESSION: Mild cardiomegaly, interstitial prominence likely represents pulmonary edema with small pleural effusion. Bibasilar atelectasis versus confluent edema, less likely pneumonia.   Electronically Signed   By: Awilda Metroourtnay  Bloomer   On: 09/10/2014 21:38   Ct Angio Chest Pe W/cm &/or Wo Cm  09/11/2014   CLINICAL DATA:  Short of breath.  EXAM: CT ANGIOGRAPHY CHEST WITH CONTRAST  TECHNIQUE: Multidetector CT imaging of the chest was performed using the standard protocol during bolus administration of intravenous contrast. Multiplanar CT image reconstructions and MIPs were obtained to evaluate the vascular anatomy.  CONTRAST:  100mL OMNIPAQUE IOHEXOL 350 MG/ML SOLN  COMPARISON:  Radiograph 09/10/2014  FINDINGS: There are no filling defects within the pulmonary arteries to suggest acute pulmonary embolism. No acute findings of the aorta or great vessels. Esophagus is normal.  Review of the lung parenchyma demonstrates bilateral pleural effusions and dense bibasilar passive atelectasis. Fine ground-glass opacity in the left upper lobe suggesting mild edema.  No axillary supraclavicular lymphadenopathy. No mediastinal hilar lymphadenopathy.  Limited upper abdomen is unremarkable. Limited view of the skeleton unremarkable.  Review of the MIP images confirms the above findings.  IMPRESSION: 1. No evidence acute pulmonary embolism. 2. Bilateral pleural effusions and dense bibasilar passive atelectasis   Electronically Signed   By: Genevive BiStewart  Edmunds M.D.   On: 09/11/2014 13:56    ASSESSMENT AND PLAN  1. Acute diastolic HF  - continue PO lasix 40mg  BID  2. Newly diagnosed LV dysfunction  - echo 09/11/2014 EF 45-50%, moderate LVH, anteroseptal and septal hypokinesis, grade 1 diastolic dysfunction.  - pending Lexiscan stress test today  3. Malignant HTN  -  better controlled on metoprolol and irbesartan. SBP 180s this morning as he has not received morning med. Will need to restart BP med as soon as able.  Ramond DialSigned, Meng, Hao PA-C Pager: [phone]  I have seen and examined the patient along with Azalee CourseMeng, Hao PA-C.  I have reviewed the chart, notes and new data.  I agree with PA's note.  Key new complaints: no dyspnea Key examination changes: no clinical signs of HF Key new findings / data: nuclear study today  PLAN: Cath tomorrow if abnormalities are seen on nuclear scan. Maximize medical Rx for HTN, CHF.  Thurmon FairMihai Aariah Godette, MD, [facility] Inst[facility]ACC [facility] [phone] 09/14/2014, 10:56 AM

## 2014-09-15 ENCOUNTER — Encounter (HOSPITAL_COMMUNITY)
Admission: EM | Disposition: A | Discharge status: Home / Self Care | Payer: Self-pay | Source: Home / Self Care | Attending: Internal Medicine

## 2014-09-15 DIAGNOSIS — I429 Cardiomyopathy, unspecified: Secondary | ICD-10-CM

## 2014-09-15 DIAGNOSIS — E119 Type 2 diabetes mellitus without complications: Secondary | ICD-10-CM

## 2014-09-15 DIAGNOSIS — I509 Heart failure, unspecified: Secondary | ICD-10-CM

## 2014-09-15 DIAGNOSIS — F203 Undifferentiated schizophrenia: Secondary | ICD-10-CM

## 2014-09-15 DIAGNOSIS — I5041 Acute combined systolic (congestive) and diastolic (congestive) heart failure: Principal | ICD-10-CM

## 2014-09-15 DIAGNOSIS — J9601 Acute respiratory failure with hypoxia: Secondary | ICD-10-CM

## 2014-09-15 HISTORY — PX: LEFT HEART CATHETERIZATION WITH CORONARY ANGIOGRAM: SHX5451

## 2014-09-15 LAB — GLUCOSE, CAPILLARY
GLUCOSE-CAPILLARY: 124 mg/dL — AB (ref 70–99)
Glucose-Capillary: 131 mg/dL — ABNORMAL HIGH (ref 70–99)
Glucose-Capillary: 166 mg/dL — ABNORMAL HIGH (ref 70–99)
Glucose-Capillary: 117 mg/dL — ABNORMAL HIGH (ref 70–99)

## 2014-09-15 LAB — PROTIME-INR
INR: 1.05 (ref 0.00–1.49)
PROTHROMBIN TIME: 13.8 s (ref 11.6–15.2)

## 2014-09-15 SURGERY — LEFT HEART CATHETERIZATION WITH CORONARY ANGIOGRAM
Anesthesia: LOCAL

## 2014-09-15 MED ORDER — LIDOCAINE HCL (PF) 1 % IJ SOLN
INTRAMUSCULAR | Status: AC
  Filled 2014-09-15: qty 30

## 2014-09-15 MED ORDER — SODIUM CHLORIDE 0.9 % IJ SOLN
3.0000 mL | Freq: Two times a day (BID) | INTRAMUSCULAR | Status: DC
  Administered 2014-09-15: 3 mL via INTRAVENOUS

## 2014-09-15 MED ORDER — ASPIRIN 81 MG PO CHEW: 81.0000 mg | CHEWABLE_TABLET | ORAL | Status: DC

## 2014-09-15 MED ORDER — CARVEDILOL 12.5 MG PO TABS
12.5000 mg | ORAL_TABLET | Freq: Two times a day (BID) | ORAL | Status: DC
  Administered 2014-09-15 – 2014-09-16 (×3): 12.5 mg via ORAL
  Filled 2014-09-15 (×8): qty 1

## 2014-09-15 MED ORDER — HEPARIN SODIUM (PORCINE) 1000 UNIT/ML IJ SOLN
INTRAMUSCULAR | Status: AC
  Filled 2014-09-15: qty 1

## 2014-09-15 MED ORDER — SODIUM CHLORIDE 0.9 % IV SOLN: 250.0000 mL | INTRAVENOUS | Status: DC | PRN

## 2014-09-15 MED ORDER — HEPARIN (PORCINE) IN NACL 2-0.9 UNIT/ML-% IJ SOLN
INTRAMUSCULAR | Status: AC
  Filled 2014-09-15: qty 1500

## 2014-09-15 MED ORDER — ASPIRIN 81 MG PO CHEW
81.0000 mg | CHEWABLE_TABLET | Freq: Every day | ORAL | Status: DC
  Administered 2014-09-16: 10:00:00 81 mg via ORAL
  Filled 2014-09-15 (×2): qty 1

## 2014-09-15 MED ORDER — NITROGLYCERIN 1 MG/10 ML FOR IR/CATH LAB
INTRA_ARTERIAL | Status: AC
  Filled 2014-09-15: qty 10

## 2014-09-15 MED ORDER — LIVING WELL WITH DIABETES BOOK
Freq: Once | Status: AC
  Administered 2014-09-15: 21:00:00
  Filled 2014-09-15: qty 1

## 2014-09-15 MED ORDER — ASPIRIN 81 MG PO CHEW
81.0000 mg | CHEWABLE_TABLET | ORAL | Status: AC
  Administered 2014-09-15: 81 mg via ORAL
  Filled 2014-09-15: qty 1

## 2014-09-15 MED ORDER — ASPIRIN EC 81 MG PO TBEC
81.0000 mg | DELAYED_RELEASE_TABLET | Freq: Every day | ORAL | Status: DC
  Filled 2014-09-15: qty 1

## 2014-09-15 MED ORDER — SODIUM CHLORIDE 0.9 % IJ SOLN: 3.0000 mL | INTRAMUSCULAR | Status: DC | PRN

## 2014-09-15 MED ORDER — ACETAMINOPHEN 325 MG PO TABS
650.0000 mg | ORAL_TABLET | ORAL | Status: DC | PRN
  Administered 2014-09-15: 650 mg via ORAL
  Filled 2014-09-15: qty 2

## 2014-09-15 MED ORDER — SODIUM CHLORIDE 0.9 % IV SOLN: INTRAVENOUS | Status: AC

## 2014-09-15 MED ORDER — MORPHINE SULFATE 2 MG/ML IJ SOLN: 2.0000 mg | INTRAMUSCULAR | Status: DC | PRN

## 2014-09-15 MED ORDER — ONDANSETRON HCL 4 MG/2ML IJ SOLN: 4.0000 mg | Freq: Four times a day (QID) | INTRAMUSCULAR | Status: DC | PRN

## 2014-09-15 NOTE — Care Management Note (Signed)
CARE MANAGEMENT NOTE 09/15/2014  Patient:  Kurt Cummings,Kurt Cummings   Account Number:  [id number]  Date Initiated:  09/11/2014  Documentation initiated by:  DAVIS,RHONDA  Subjective/Objective Assessment:   hypertensive crisis requiring iv ntg drp     Action/Plan:   home when stable   Anticipated DC Date:  09/15/2014   Anticipated DC Plan:  HOME W HOME HEALTH SERVICES  In-house referral  NA      DC Planning Services  CM consult      [facility]   Choice offered to / List presented to:  C-1 Patient      DME agency  NA     HH arranged  HH-1 RN      [facility]  [facility]   Status of service:  Completed, signed off Medicare Important Message given?   (If response is "NO", the following Medicare IM given date fields will be blank) Date Medicare IM given:   Medicare IM given by:   Date Additional Medicare IM given:   Additional Medicare IM given by:    Discharge Disposition:  HOME W HOME HEALTH SERVICES  Per UR Regulation:  Reviewed for med. necessity/level of care/duration of stay  If discussed at Long Length of Stay Meetings, dates discussed:    Comments:  09/15/14 Spoke with pt about PCP needs.  Pt states that Medicare is his primary insurance.  Gave pt list of PCPs in [place] that accept Medicare.  Encouraged pt to start calling to get Cummings follow up appointment with one of them.  09/14/2014 1420 [facility] Provided pt with [facility] to [facility]. Kurt DonningAlesia Shavis RN CCM Case Mgmt phone [phone]  09/14/2014 1230 NCM spoke to pt and states he lives at home alone. He drives and is able to get out to his appts. Pt states his Med Cummings&B is primary insurance. Explained to pt BCBS showing primary. Pt states he does not have Cummings PCP at this time. He prefers to go to classes to learn about diabetes. Offered pt choice for [facility] for diabetes educ in the home also. States he does not want HH RN at this time. Waiting final dc  orders. [facility]. Diabetes Coordinator arranged with [facility] for classes. Kurt DonningAlesia Shavis RN CM Case Mgmt phone [phone]  [id number]/Rhonda Earlene Plateravis, RN, BSN, CCM Chart reviewed. Discharge needs and patient's stay to be reviewed and followed by case manager. Chart note for progression of stay: On admission, blood pressure was as high as 227/119. With the nitroglycerin drip it trended down to 150/114. HR was 93 - 117, oxygen saturation was 89% on room air. With nasal cannula oxygen support it has improved to 99%. Blood work was significant for elevated white blood cell count is 14, normal troponin level, BNP 957, d-dimer slightly elevated at 0.64. The 12 lead EKG showed sinus tachycardia. Urine drug screen was normal. Urinalysis was significant for glucose of 500, no ketones and no leukocytes. CBG was 184 and glucose on BMET was 269.   Patient was admitted to step down unit for management of accelerated hypertension. Patient is on nitroglycerin drip. Cardiology will see the patient in consultation.   Assessment/Plan:   Principal problem Acute respiratory failure with hypoxia / acute diastolic CHF exacerbation - Patient reports no previous history of CHF. BNP on the admission was 957. - Patient was given Lasix on the  admission, we will continue current lasix dose 40 mg IV twice daily. - Chest x-ray showed mild cardiomegaly, interstitial prominence likely representing pulmonary edema with small pleural effusion. - Troponin level 2 negative. The 12-lead EKG on the admission showed sinus tachycardia.   - Weight on the admission was 117.9 kg. This morning, recorded weight is 123.7 kg. Only trace LE edema on physical exam. Continue daily weight and strict intake and output. Since admission, negative 2.1 L fluid balance. - Replete electrolytes as needed. Potassium was 3.2 this morning and was supplemented with potassium chloride 40 mEq by mouth. -  Obtain 2-D echo. - May continue aspirin daily. - Appreciate cardiology consult and their recommendation.

## 2014-09-15 NOTE — Progress Notes (Signed)
Progress Note   Kurt SeminoleRobert A Lamson YQM:[id number]:[id number] DOB: 1966-03-24 DOA: 09/10/2014 PCP: No primary care provider on file.   Brief Narrative:   Kurt Cummings is an 48 y.o. male with a PMH of schizophrenia, who was admitted 09/10/2014 with a one-week history of worsening shortness of breath.  On admission, blood pressure was as high as 227/119. Patient was treated with a nitroglycerin drip with improvement in BP. He was also mildly hypoxic with an oxygen saturation of 89% on room air, treated with oxygen via nasal cannula.  Blood work was significant for elevated WBC of 14, normal troponin level, BNP 957, d-dimer slightly elevated at 0.64. The 12 lead EKG showed sinus tachycardia. Urine drug screen was normal. Urinalysis was significant for glucose of 500, no ketones and no leukocytes. CBG was 184 and glucose on BMET was 269.  Patient was admitted to step down unit for management of accelerated hypertension.  Assessment/Plan:   Principal Problem:   Acute respiratory failure with hypoxia secondary to acute systolic and diastolic CHF / dyspnea / chest pain  Patient was admitted and placed on Lasix and nitroglycerin with subsequent stabilization of BP/respiratory status.  Troponins negative 3 sets. Continue aspirin therapy.  2-D echo done 09/11/14 which showed mildly reduced systolic function with an EF of 45-50 percent and grade 1 diastolic dysfunction.  Given elevated d-dimer, CT angiogram was done to rule out PE, which was negative.  Cardiology on board.  Stress test abnormal, for cardiac catheterization today.  Active Problems:   Hypertensive crisis/emergency  Initially treated with a nitroglycerin drip. Systolic blood pressures in the 150s.  Continue furosemide, Avapro, metoprolol.    Monitor and adjust BP medications for improved BP control.    New onset type 2 diabetes mellitus  Hemoglobin A1c 10%.  Currently being managed with moderate scale SSI, 20 units of Lantus  daily at bedtime and 4 units of meal coverage 3 times a day. CBGs 133-164.   Lipids okay with an LDL of 49.  Outpatient DM education set up.  Will need insulin pens at discharge.    Hypokalemia   Secondary to diuretics. Continue replacement therapy.    Prolonged QT interval  Monitored on telemetry.    Schizophrenia  Does not appear to be on any psychiatric medications.  No evidence of psychosis.    Leukocytosis  No evidence of infection, resolved.    Hyponatremia  Likely secondary to CHF physiology.    DVT Prophylaxis  Continue Lovenox.  Code Status: Full. Family Communication: No family at the bedside. Disposition Plan: Home when stable.   IV Access:    Peripheral IV   Procedures and diagnostic studies:   Dg Chest 2 View 09/10/2014 Mild cardiomegaly, interstitial prominence likely represents pulmonary edema with small pleural effusion. Bibasilar atelectasis versus confluent edema, less likely pneumonia.   Ct Angio Chest Pe W/cm &/or Wo Cm 09/11/2014: 1. No evidence acute pulmonary embolism. 2. Bilateral pleural effusions and dense bibasilar passive atelectasis.  2D echocardiogram 09/11/14: Moderate LVH. Mildly reduced systolic function with an EF of 45-50 percent. Anteroseptal and septal hypokinesis. Grade 1 diastolic dysfunction.  Nm Myocar Multi W/spect W/wall Motion / Ef 09/14/2014: 1. Inferior and inferolateral scar without evidence of inducible myocardial ischemia.  2. Global hypokinesis with basal septal akinesis/ mild dyskinesis.  3. Left ventricular ejection fraction 36% with moderate LV cavity dilatation.  4.  Intermediate to high-risk stress test findings*.  *2012 Appropriate Use Criteria for Coronary Revascularization Focused Update: J Am  Coll Cardiol. 2012;59(9):857-881. http://content.dementiazones.comonlinejacc.org/article.aspx?articleid=1201161     Medical Consultants:    Dr. Zoila ShutterKenneth Hilty, Cardiology.  Anti-Infectives:    None.  Subjective:   Kurt Seminoleobert  A Cummings remains without complaints of dyspnea, chest pain, nausea.  Objective:    Filed Vitals:   09/15/14 0300 09/15/14 0439 09/15/14 1439 09/15/14 1537  BP:  148/83 149/92   Pulse:  73 78 86  Temp:  97.9 F (36.6 C) 98.4 F (36.9 C)   TempSrc:  Oral Oral   Resp:  18 16   Height:      Weight: 117.164 kg (258 lb 4.8 oz)     SpO2:  98% 100%     Intake/Output Summary (Last 24 hours) at 09/15/14 1656 Last data filed at 09/15/14 1400  Gross per 24 hour  Intake    600 ml  Output      0 ml  Net    600 ml    Exam: Gen:  NAD Cardiovascular:  RRR, No M/R/G Respiratory:  Lungs CTAB Gastrointestinal:  Abdomen soft, NT/ND, + BS Extremities:  No C/E/C, hemosiderin deposits, LLE with trace edema   Data Reviewed:    Labs: Basic Metabolic Panel:  Recent Labs Lab 09/10/14 2048 09/11/14 0017 09/11/14 0620 09/12/14 0404 09/13/14 0500 09/14/14 0500  NA 134*  --  131* 135* 138 136*  K 3.8  --  3.2* 3.4* 3.6* 3.6*  CL 98  --  94* 96 100 97  CO2 20  --  24 26 26 24   GLUCOSE 251*  --  269* 194* 155* 140*  BUN 16  --  14 16 28* 25*  CREATININE 0.63 0.61 0.65 0.98 0.92 0.88  CALCIUM 9.1  --  8.7 9.1 9.3 9.5  MG  --   --  1.7  --   --   --   PHOS  --   --  2.8  --   --   --    GFR Estimated Creatinine Clearance: 128.3 mL/min (by C-G formula based on Cr of 0.88). Liver Function Tests:  Recent Labs Lab 09/11/14 0620  AST 32  ALT 68*  ALKPHOS 87  BILITOT 1.0  PROT 6.5  ALBUMIN 3.5    CBC:  Recent Labs Lab 09/10/14 2048 09/11/14 0017 09/11/14 0620  WBC 14.8* 12.3* 9.7  HGB 14.1 12.8* 12.2*  HCT 39.0 36.8* 35.2*  MCV 77.4* 77.8* 78.2  PLT 204 220 212   Cardiac Enzymes:  Recent Labs Lab 09/11/14 0017 09/11/14 0620 09/11/14 1217  TROPONINI <0.30 <0.30 <0.30   BNP (last 3 results)  Recent Labs  09/10/14 2047  PROBNP 957.1*   CBG:  Recent Labs Lab 09/14/14 1712 09/14/14 2151 09/15/14 0853 09/15/14 1216 09/15/14 1650  GLUCAP 160* 141*  131* 166* 117*   Lipid Profile: No results for input(s): CHOL, HDL, LDLCALC, TRIG, CHOLHDL, LDLDIRECT in the last 72 hours.  Microbiology Recent Results (from the past 240 hour(s))  MRSA PCR Screening     Status: None   Collection Time: 09/11/14 12:07 AM  Result Value Ref Range Status   MRSA by PCR NEGATIVE NEGATIVE Final    Comment:        The GeneXpert MRSA Assay (FDA approved for NASAL specimens only), is one component of a comprehensive MRSA colonization surveillance program. It is not intended to diagnose MRSA infection nor to guide or monitor treatment for MRSA infections.      Medications:   . [START ON 09/16/2014] aspirin EC  81 mg Oral  Daily  . carvedilol  12.5 mg Oral BID WC  . docusate sodium  100 mg Oral BID  . enoxaparin (LOVENOX) injection  60 mg Subcutaneous Daily  . insulin aspart  0-15 Units Subcutaneous TID WC  . insulin aspart  0-5 Units Subcutaneous QHS  . potassium chloride  30 mEq Oral BID  . sodium chloride  3 mL Intravenous Q12H   Continuous Infusions:    Time spent: 25 minutes.    LOS: 5 days   Leilani Cespedes  [facility] Pager [phone]. If unable to reach me by pager, please call my cell phone at [phone].  *Please refer to [email or web address], password [email or web address] to get updated schedule on who will round on this patient, as hospitalists switch teams weekly. If 7PM-7AM, please contact night-coverage at [email or web address], password [email or web address] for any overnight needs.  09/15/2014, 4:56 PM

## 2014-09-15 NOTE — Progress Notes (Signed)
Patient Name: Kurt SeminoleRobert A Cummings Date of Encounter: 09/15/2014  Principal Problem:   Acute respiratory failure with hypoxia Active Problems:   Dyspnea   Hypertensive crisis   New onset type 2 diabetes mellitus   Hypertensive emergency   Prolonged QT interval   Schizophrenia   Leukocytosis   Chest pain   Hypokalemia   Acute systolic congestive heart failure   Cardiomyopathy   Hyperglycemia   Hypertensive urgency   Length of Stay: 5  SUBJECTIVE  No angina or dyspnea.  CURRENT MEDS . [START ON 09/16/2014] aspirin EC  81 mg Oral Daily  . docusate sodium  100 mg Oral BID  . enoxaparin (LOVENOX) injection  60 mg Subcutaneous Daily  . furosemide  40 mg Oral BID  . insulin aspart  0-15 Units Subcutaneous TID WC  . insulin aspart  0-5 Units Subcutaneous QHS  . irbesartan  300 mg Oral Daily  . metoprolol tartrate  25 mg Oral BID  . potassium chloride  30 mEq Oral BID  . sodium chloride  3 mL Intravenous Q12H  . sodium chloride  3 mL Intravenous Q12H    OBJECTIVE   Intake/Output Summary (Last 24 hours) at 09/15/14 0851 Last data filed at 09/14/14 1230  Gross per 24 hour  Intake    360 ml  Output      0 ml  Net    360 ml   Filed Weights   09/13/14 0500 09/14/14 0500 09/15/14 0300  Weight: 257 lb 6.4 oz (116.756 kg) 258 lb 12.8 oz (117.391 kg) 258 lb 4.8 oz (117.164 kg)    PHYSICAL EXAM Filed Vitals:   09/14/14 1435 09/14/14 2121 09/15/14 0300 09/15/14 0439  BP: 158/93 161/109  148/83  Pulse: 78 73  73  Temp: 97.9 F (36.6 C) 97.2 F (36.2 C)  97.9 F (36.6 C)  TempSrc: Oral Oral  Oral  Resp: 18 16  18   Height:      Weight:   258 lb 4.8 oz (117.164 kg)   SpO2: 100% 100%  98%   General: Alert, oriented x3, no distress Head: no evidence of trauma, PERRL, EOMI, no exophtalmos or lid lag, no myxedema, no xanthelasma; normal ears, nose and oropharynx Neck: normal jugular venous pulsations and no hepatojugular reflux; brisk carotid pulses without delay and no  carotid bruits Chest: clear to auscultation, no signs of consolidation by percussion or palpation, normal fremitus, symmetrical and full respiratory excursions Cardiovascular: normal position and quality of the apical impulse, regular rhythm, normal first and second heart sounds, no rubs or gallops, no murmur Abdomen: no tenderness or distention, no masses by palpation, no abnormal pulsatility or arterial bruits, normal bowel sounds, no hepatosplenomegaly Extremities: no clubbing, cyanosis or edema; 2+ radial, ulnar and brachial pulses bilaterally; 2+ right femoral, posterior tibial and dorsalis pedis pulses; 2+ left femoral, posterior tibial and dorsalis pedis pulses; no subclavian or femoral bruits Neurological: grossly nonfocal LABS  CBC No results for input(s): WBC, NEUTROABS, HGB, HCT, MCV, PLT in the last 72 hours. Basic Metabolic Panel  Recent Labs  09/13/14 0500 09/14/14 0500  NA 138 136*  K 3.6* 3.6*  CL 100 97  CO2 26 24  GLUCOSE 155* 140*  BUN 28* 25*  CREATININE 0.92 0.88  CALCIUM 9.3 9.5   Radiology Studies Imaging results have been reviewed and Nm Myocar Multi W/spect W/wall Motion / Ef  09/14/2014   CLINICAL DATA:  Respiratory failure, hypertensive crisis, CHF and cardiomyopathy.  EXAM: MYOCARDIAL IMAGING WITH SPECT (REST  AND PHARMACOLOGIC-STRESS)  GATED LEFT VENTRICULAR WALL MOTION STUDY  LEFT VENTRICULAR EJECTION FRACTION  TECHNIQUE: Standard myocardial SPECT imaging was performed after resting intravenous injection of 10 mCi Tc-4580m sestamibi. Subsequently, intravenous infusion of Lexiscan was performed under the supervision of the Cardiology staff. At peak effect of the drug, 30 mCi Tc-6680m sestamibi was injected intravenously and standard myocardial SPECT imaging was performed. Quantitative gated imaging was also performed to evaluate left ventricular wall motion, and estimate left ventricular ejection fraction.  COMPARISON:  None.  FINDINGS: Perfusion: There is evidence  of inferior and inferolateral wall scar. Perfusion defect is medium in size. No evidence of inducible myocardial ischemia.  Wall Motion: The left ventricle is moderately dilated and shows global hypokinesis. Basal aspect of the septum shows akinesis/mild dyskinesis.  Left Ventricular Ejection Fraction: 36 %  End diastolic volume 193 ml  End systolic volume 123 ml  IMPRESSION: 1. Inferior and inferolateral scar without evidence of inducible myocardial ischemia.  2. Global hypokinesis with basal septal akinesis/ mild dyskinesis.  3. Left ventricular ejection fraction 36% with moderate LV cavity dilatation.  4.  Intermediate to high-risk stress test findings*.  *2012 Appropriate Use Criteria for Coronary Revascularization Focused Update: J Am Coll Cardiol. 2012;59(9):857-881. http://content.dementiazones.comonlinejacc.org/article.aspx?articleid=1201161   Electronically Signed   By: Irish LackGlenn  Yamagata M.D.   On: 09/14/2014 13:15    TELE NSR   ASSESSMENT AND PLAN High risk nuclear scan with a large fixed defect and reduced systolic function in a newly diagnosed diabetic with moderately depressed LV function. 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 Risks include bleeding, vascular/renal injury, arrhythmia, MI, stroke, loss of limb or life. He displayed clear understanding and agreed to proceed.  Discussed role of PCI/stent (and relative strengths of bare metal versus drug eluting stents) versus CABG, depending on extent of CAD. Hold irbesartan for cath. Resume afterwards. Hold furosemide and resume at lower dose (40 mg daily may be enough). May do better with carvedilol rather than metoprolol, for better BP control and DM and low EF. BP still high, no signs or symptoms of decompensated HF/hypervolemia today. Carvedilol 12.5 mg BID.  Thurmon FairMihai Jenisse Vullo, MD, [facility] Campus[facility] [phone] office [phone]  pager 09/15/2014 8:51 AM

## 2014-09-15 NOTE — CV Procedure (Signed)
Kurt SeminoleRobert A Cummings is a 48 y.o. male    [id number] LOCATION:  FACILITY: [facility]  PHYSICIAN: Nanetta BattyJonathan Rhya Shan, M.D. 03-12-1966   DATE OF PROCEDURE:  09/15/2014  DATE OF DISCHARGE:     CARDIAC CATHETERIZATION     History obtained from chart review. Kurt Cummings is a 48 year old moderately overweight single schizophrenic male weekend congestive heart failure. Myocardial infarction. Echo revealed mild LV dysfunction ejection fraction 45% range. A Myoview stress test showed scar in the inferior wall. The patient presents now for diagnostic arteriography to rule out ischemic etiology.   PROCEDURE DESCRIPTION:   The patient was brought to the second floor [place] [facility] cath lab in the postabsorptive state. He was notpremedicated . His right wristwas prepped and shaved in usual sterile fashion. Xylocaine 1% was used for local anesthesia. A 6 French sheath was inserted into the right radial artery using standard Seldinger technique. The patient received 4000 units  of heparin  intravenously.  5 [place]French guide catheter and pigtail catheters were used for selective coronary angiography and left ventriculography respectively. Visipaque dye was used for the entirety of the case. Retrograde aortic  left ventricular and pressures were recorded.    HEMODYNAMICS:    AO SYSTOLIC/AO DIASTOLIC: 153/101   LV SYSTOLIC/LV DIASTOLIC: 150/27  ANGIOGRAPHIC RESULTS:   1. Left main; normal  2. LAD; normal 3. Left circumflex; normal.  4. Right coronary artery; dominant and normal 5. Left ventriculography; RAO left ventriculogram was performed using  25 mL of Visipaque dye at 12 mL/second. The overall LVEF estimated  45-50 %  With wall motion abnormalities  IMPRESSION: Kurt Cummings has normal arteries and mild LV dysfunction. This is probably related to hypertensive heart disease. Continue medical therapy recommended. The sheath was removed and a TR band placed on the right wrist  to achieve patent  hemostasis. The patient left the lab in stable condition.  Runell GessBERRY,Kurt Gillaspie J. MD, [facility] 09/15/2014 4:12 PM

## 2014-09-15 NOTE — Plan of Care (Signed)
Problem: Consults Goal: Skin Care Protocol Initiated - if Braden Score 18 or less If consults are not indicated, leave blank or document N/A  Outcome: Not Applicable Date Met:  18/33/58 Goal: Tobacco Cessation referral if indicated Outcome: Not Applicable Date Met:  25/18/98 Goal: Nutrition Consult-if indicated Outcome: Not Applicable Date Met:  42/10/31 Goal: Diabetes Guidelines if Diabetic/Glucose > 140 If diabetic or lab glucose is > 140 mg/dl - Initiate Diabetes/Hyperglycemia Guidelines & Document Interventions  Outcome: Completed/Met Date Met:  09/15/14  Problem: Phase I Progression Outcomes Goal: Hemodynamically stable Outcome: Completed/Met Date Met:  09/15/14 Goal: Other Phase I Outcomes/Goals Outcome: Not Applicable Date Met:  28/11/88  Problem: Phase II Progression Outcomes Goal: Walk in hall or up in chair TID Outcome: Completed/Met Date Met:  09/15/14 Goal: Begin discharge teaching Outcome: Progressing

## 2014-09-15 NOTE — Progress Notes (Signed)
Patient demonstrated proper self-administration of insulin.  Needs continued reinforcement.  Allayne ButcherMiller, [facility]  09/15/2014

## 2014-09-15 NOTE — Plan of Care (Signed)
Problem: Phase III Progression Outcomes Goal: Pain controlled on oral analgesia Outcome: Completed/Met Date Met:  09/15/14  Problem: Consults Goal: Skin Care Protocol Initiated - if Braden Score 18 or less If consults are not indicated, leave blank or document N/A  Outcome: Not Applicable Date Met:  09/15/14 Goal: Nutrition Consult-if indicated Outcome: Not Applicable Date Met:  09/15/14 Goal: Diabetes Guidelines if Diabetic/Glucose > 140 If diabetic or lab glucose is > 140 mg/dl - Initiate Diabetes/Hyperglycemia Guidelines & Document Interventions  Outcome: Completed/Met Date Met:  09/15/14  Problem: Phase I Progression Outcomes Goal: CBGs steadily decreasing with treatment Outcome: Progressing Goal: NPO or per MD order Outcome: Not Applicable Date Met:  09/15/14 Goal: OOB as tolerated unless otherwise ordered Outcome: Completed/Met Date Met:  09/15/14     

## 2014-09-15 NOTE — Plan of Care (Signed)
Problem: Consults Goal: Cardiac Cath Patient Education (See Patient Education module for education specifics.) Outcome: Completed/Met Date Met:  09/15/14 Goal: Skin Care Protocol Initiated - if Braden Score 18 or less If consults are not indicated, leave blank or document N/A Outcome: Completed/Met Date Met:  09/15/14 Goal: Diabetes Guidelines if Diabetic/Glucose > 140 If diabetic or lab glucose is > 140 mg/dl - Initiate Diabetes/Hyperglycemia Guidelines & Document Interventions  Outcome: Completed/Met Date Met:  09/15/14  Problem: Phase I Progression Outcomes Goal: Pain controlled with appropriate interventions Outcome: Completed/Met Date Met:  09/15/14 Goal: Voiding-avoid urinary catheter unless indicated Outcome: Completed/Met Date Met:  09/15/14 Goal: Hemodynamically stable Outcome: Completed/Met Date Met:  09/15/14 Goal: Distal pulses equal to baseline Outcome: Completed/Met Date Met:  09/15/14 Goal: Vascular site scale level 0 - I Vascular Site Scale Level 0: No bruising/bleeding/hematoma Level I (Mild): Bruising/Ecchymosis, minimal bleeding/ooozing, palpable hematoma < 3 cm Level II (Moderate): Bleeding not affecting hemodynamic parameters, pseudoaneurysm, palpable hematoma > 3 cm Level III (Severe) Bleeding which affects hemodynamic parameters or retroperitoneal hemorrhage  Outcome: Completed/Met Date Met:  09/15/14     

## 2014-09-15 NOTE — Plan of Care (Signed)
Problem: Phase II Progression Outcomes Goal: Discharge plan established Outcome: Not Applicable Date Met:  73/22/02 Goal: Fluid volume status improved Outcome: Completed/Met Date Met:  09/15/14  Problem: Phase III Progression Outcomes Goal: Dyspnea controlled with activity Outcome: Completed/Met Date Met:  09/15/14

## 2014-09-15 NOTE — Progress Notes (Signed)
TR BAND REMOVAL  LOCATION:    right radial  DEFLATED PER PROTOCOL:    Yes.    TIME BAND OFF / DRESSING APPLIED:    19:35   SITE UPON ARRIVAL:    Level 0  SITE AFTER BAND REMOVAL:    Level 0  REVERSE ALLEN'S TEST:     positive  CIRCULATION SENSATION AND MOVEMENT:    Within Normal Limits   Yes.    COMMENTS:

## 2014-09-15 NOTE — H&P (View-Only) (Signed)
Patient Name: Kurt Cummings Date of Encounter: 09/15/2014  Principal Problem:   Acute respiratory failure with hypoxia Active Problems:   Dyspnea   Hypertensive crisis   New onset type 2 diabetes mellitus   Hypertensive emergency   Prolonged QT interval   Schizophrenia   Leukocytosis   Chest pain   Hypokalemia   Acute systolic congestive heart failure   Cardiomyopathy   Hyperglycemia   Hypertensive urgency   Length of Stay: 5  SUBJECTIVE  No angina or dyspnea.  CURRENT MEDS . [START ON 09/16/2014] aspirin EC  81 mg Oral Daily  . docusate sodium  100 mg Oral BID  . enoxaparin (LOVENOX) injection  60 mg Subcutaneous Daily  . furosemide  40 mg Oral BID  . insulin aspart  0-15 Units Subcutaneous TID WC  . insulin aspart  0-5 Units Subcutaneous QHS  . irbesartan  300 mg Oral Daily  . metoprolol tartrate  25 mg Oral BID  . potassium chloride  30 mEq Oral BID  . sodium chloride  3 mL Intravenous Q12H  . sodium chloride  3 mL Intravenous Q12H    OBJECTIVE   Intake/Output Summary (Last 24 hours) at 09/15/14 0851 Last data filed at 09/14/14 1230  Gross per 24 hour  Intake    360 ml  Output      0 ml  Net    360 ml   Filed Weights   09/13/14 0500 09/14/14 0500 09/15/14 0300  Weight: 257 lb 6.4 oz (116.756 kg) 258 lb 12.8 oz (117.391 kg) 258 lb 4.8 oz (117.164 kg)    PHYSICAL EXAM Filed Vitals:   09/14/14 1435 09/14/14 2121 09/15/14 0300 09/15/14 0439  BP: 158/93 161/109  148/83  Pulse: 78 73  73  Temp: 97.9 F (36.6 C) 97.2 F (36.2 C)  97.9 F (36.6 C)  TempSrc: Oral Oral  Oral  Resp: 18 16  18  Height:      Weight:   258 lb 4.8 oz (117.164 kg)   SpO2: 100% 100%  98%   General: Alert, oriented x3, no distress Head: no evidence of trauma, PERRL, EOMI, no exophtalmos or lid lag, no myxedema, no xanthelasma; normal ears, nose and oropharynx Neck: normal jugular venous pulsations and no hepatojugular reflux; brisk carotid pulses without delay and no  carotid bruits Chest: clear to auscultation, no signs of consolidation by percussion or palpation, normal fremitus, symmetrical and full respiratory excursions Cardiovascular: normal position and quality of the apical impulse, regular rhythm, normal first and second heart sounds, no rubs or gallops, no murmur Abdomen: no tenderness or distention, no masses by palpation, no abnormal pulsatility or arterial bruits, normal bowel sounds, no hepatosplenomegaly Extremities: no clubbing, cyanosis or edema; 2+ radial, ulnar and brachial pulses bilaterally; 2+ right femoral, posterior tibial and dorsalis pedis pulses; 2+ left femoral, posterior tibial and dorsalis pedis pulses; no subclavian or femoral bruits Neurological: grossly nonfocal LABS  CBC No results for input(s): WBC, NEUTROABS, HGB, HCT, MCV, PLT in the last 72 hours. Basic Metabolic Panel  Recent Labs  09/13/14 0500 09/14/14 0500  NA 138 136*  K 3.6* 3.6*  CL 100 97  CO2 26 24  GLUCOSE 155* 140*  BUN 28* 25*  CREATININE 0.92 0.88  CALCIUM 9.3 9.5   Radiology Studies Imaging results have been reviewed and Nm Myocar Multi W/spect W/wall Motion / Ef  09/14/2014   CLINICAL DATA:  Respiratory failure, hypertensive crisis, CHF and cardiomyopathy.  EXAM: MYOCARDIAL IMAGING WITH SPECT (REST   AND PHARMACOLOGIC-STRESS)  GATED LEFT VENTRICULAR WALL MOTION STUDY  LEFT VENTRICULAR EJECTION FRACTION  TECHNIQUE: Standard myocardial SPECT imaging was performed after resting intravenous injection of 10 mCi Tc-4580m sestamibi. Subsequently, intravenous infusion of Lexiscan was performed under the supervision of the Cardiology staff. At peak effect of the drug, 30 mCi Tc-6680m sestamibi was injected intravenously and standard myocardial SPECT imaging was performed. Quantitative gated imaging was also performed to evaluate left ventricular wall motion, and estimate left ventricular ejection fraction.  COMPARISON:  None.  FINDINGS: Perfusion: There is evidence  of inferior and inferolateral wall scar. Perfusion defect is medium in size. No evidence of inducible myocardial ischemia.  Wall Motion: The left ventricle is moderately dilated and shows global hypokinesis. Basal aspect of the septum shows akinesis/mild dyskinesis.  Left Ventricular Ejection Fraction: 36 %  End diastolic volume 193 ml  End systolic volume 123 ml  IMPRESSION: 1. Inferior and inferolateral scar without evidence of inducible myocardial ischemia.  2. Global hypokinesis with basal septal akinesis/ mild dyskinesis.  3. Left ventricular ejection fraction 36% with moderate LV cavity dilatation.  4.  Intermediate to high-risk stress test findings*.  *2012 Appropriate Use Criteria for Coronary Revascularization Focused Update: J Am Coll Cardiol. 2012;59(9):857-881. http://content.dementiazones.comonlinejacc.org/article.aspx?articleid=1201161   Electronically Signed   By: Irish LackGlenn  Yamagata M.D.   On: 09/14/2014 13:15    TELE NSR   ASSESSMENT AND PLAN High risk nuclear scan with a large fixed defect and reduced systolic function in a newly diagnosed diabetic with moderately depressed LV function. 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 Risks include bleeding, vascular/renal injury, arrhythmia, MI, stroke, loss of limb or life. He displayed clear understanding and agreed to proceed.  Discussed role of PCI/stent (and relative strengths of bare metal versus drug eluting stents) versus CABG, depending on extent of CAD. Hold irbesartan for cath. Resume afterwards. Hold furosemide and resume at lower dose (40 mg daily may be enough). May do better with carvedilol rather than metoprolol, for better BP control and DM and low EF. BP still high, no signs or symptoms of decompensated HF/hypervolemia today. Carvedilol 12.5 mg BID.  Thurmon FairMihai Elveta Rape, MD, [facility] [phone] office [phone]  pager 09/15/2014 8:51 AM

## 2014-09-15 NOTE — Interval H&P Note (Signed)
Cath Lab Visit (complete for each Cath Lab visit)  Clinical Evaluation Leading to the Procedure:   ACS: No.  Non-ACS:    Anginal Classification: CCS II  Anti-ischemic medical therapy: No Therapy  Non-Invasive Test Results: Intermediate-risk stress test findings: cardiac mortality 1-3%/year  Prior CABG: No previous CABG      History and Physical Interval Note:  09/15/2014 3:30 PM  [name]  has presented today for surgery, with the diagnosis of shortness of breath  The various methods of treatment have been discussed with the patient and family. After consideration of risks, benefits and other options for treatment, the patient has consented to  Procedure(s): LEFT HEART CATHETERIZATION WITH CORONARY ANGIOGRAM (N/A) as a surgical intervention .  The patient's history has been reviewed, patient examined, no change in status, stable for surgery.  I have reviewed the patient's chart and labs.  Questions were answered to the patient's satisfaction.     Runell GessBERRY,Frederic Tones J

## 2014-09-15 NOTE — Progress Notes (Signed)
Patient signed consent form for Cardiac Catheterization.  Patient's skin prepped (hair clipped from right wrist and right groin).  Second IV started.  [facility]  [facility] here to transport patient.  Allayne ButcherMiller, Sayeed [facility]all [facility]  09/15/2014

## 2014-09-15 NOTE — Progress Notes (Signed)
Inpatient Diabetes Program Recommendations  AACE/ADA: New Consensus Statement on Inpatient Glycemic Control (2013)  Target Ranges:  Prepandial:   less than 140 mg/dL      Peak postprandial:   less than 180 mg/dL (1-2 hours)      Critically ill patients:  140 - 180 mg/dL     Results for [name], Kurt A (MRN [id number]) as of 09/15/2014 09:22  Ref. Range 09/14/2014 07:50 09/14/2014 11:42 09/14/2014 17:12 09/14/2014 21:51  Glucose-Capillary Latest Range: 70-99 mg/dL 119149 (H) 147164 (H) 829160 (H) 141 (H)      **Note patient with new diagnosis of DM. DM Coordinator spoke with patient on 11/20. DM Education was started on 11/20 and has been ongoing since admission.  **Note RNs have been working with patient on teaching insulin administration. DM educational materials have been given to patient.  **Note patient is requesting to go to Outpatient DM Education follow-up after d/c. Order has been placed and patient has already been scheduled for DM classes at the [facility]. See AVS for dates and times that have been scheduled.   MD- Patient requiring very little insulin here in hospital at present.  Only required a total of 8 units Novolog SSI on 11/21 and 5 units Novolog SSI on 11/22.   No basal insulin has been given since admission.   He is eating 100% of meals.   Not sure if patient will need insulin at time of d/c.  His A1c was 10% on admission, however, he may do well on oral meds and diet modification alone at home.  This may be a safer regimen for patient at home given his history of psychiatric illness.     Will follow Kurt Cummings [name] RN, MSN, CDE Diabetes Coordinator Inpatient Diabetes Program Team Pager: [phone] (8a-10p)

## 2014-09-16 DIAGNOSIS — I209 Angina pectoris, unspecified: Secondary | ICD-10-CM

## 2014-09-16 LAB — GLUCOSE, CAPILLARY: GLUCOSE-CAPILLARY: 138 mg/dL — AB (ref 70–99)

## 2014-09-16 MED ORDER — ASPIRIN 81 MG PO CHEW: 81.0000 mg | CHEWABLE_TABLET | Freq: Every day | ORAL | Status: AC

## 2014-09-16 MED ORDER — POTASSIUM CHLORIDE CRYS ER 15 MEQ PO TBCR: 30.0000 meq | EXTENDED_RELEASE_TABLET | Freq: Two times a day (BID) | ORAL | Status: AC

## 2014-09-16 MED ORDER — METFORMIN HCL 500 MG PO TABS: 500.0000 mg | ORAL_TABLET | Freq: Two times a day (BID) | ORAL | Status: AC

## 2014-09-16 MED ORDER — IRBESARTAN 300 MG PO TABS
300.0000 mg | ORAL_TABLET | Freq: Every day | ORAL | Status: DC
  Administered 2014-09-16: 300 mg via ORAL
  Filled 2014-09-16: qty 1

## 2014-09-16 MED ORDER — FUROSEMIDE 40 MG PO TABS
40.0000 mg | ORAL_TABLET | Freq: Every day | ORAL | Status: DC
  Administered 2014-09-16: 40 mg via ORAL
  Filled 2014-09-16 (×2): qty 1

## 2014-09-16 MED ORDER — FUROSEMIDE 40 MG PO TABS: 40.0000 mg | ORAL_TABLET | Freq: Every day | ORAL | Status: AC

## 2014-09-16 MED ORDER — CARVEDILOL 12.5 MG PO TABS: 12.5000 mg | ORAL_TABLET | Freq: Two times a day (BID) | ORAL | Status: AC

## 2014-09-16 MED ORDER — IRBESARTAN 300 MG PO TABS
300.0000 mg | ORAL_TABLET | Freq: Every day | ORAL | Status: AC
Start: 2014-09-16 — End: ?

## 2014-09-16 NOTE — Discharge Summary (Signed)
Physician Discharge Summary  Kurt Cummings:[id number] DOB: [birth year] DOA: 09/10/2014  PCP: No primary care provider on file.  Admit date: 09/10/2014 Discharge date: 09/16/2014  Time spent: 35 minutes  Recommendations for Outpatient Follow-up:  Outpatient diabetic education  Discharge Diagnoses:  Principal Problem:   Acute respiratory failure with hypoxia Active Problems:   Dyspnea   Hypertensive crisis   New onset type 2 diabetes mellitus   Hypertensive emergency   Prolonged QT interval   Schizophrenia   Leukocytosis   Chest pain   Hypokalemia   Acute systolic congestive heart failure   Cardiomyopathy   Hyperglycemia   Hypertensive urgency   CHF (congestive heart failure)   Discharge Condition: improved  Diet recommendation: diabetic/cardiac  Filed Weights   09/15/14 0300 09/16/14 0031 09/16/14 0500  Weight: 117.164 kg (258 lb 4.8 oz) 117.5 kg (259 lb 0.7 oz) 117.5 kg (259 lb 0.7 oz)    History of present illness:  Patient came in With shortness of breath for the past 1 week. He has hx of Schizophrenia which is untreated. He is not followed by any one as an outpatient. He reports trouble laying down flat.  CXR showed mild cardiomegaly and pulmonary edema. He denies any hx of CHF. He does report hx of HTN but was not taking anything for it. Patient states he has never tested for DM. Reports some frequent urination.  Reports poor sleep due to difficulty breathing. In ER he was noted to be hypertensive up to 220/130. 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 Patient have not been sleeping well and have been taking caffeine pills and drinking large amount of coffee trying to stay awake. He reports reccurent headaches and have been taking daily aspirin.  He denies any chest pain but reports some chest heaviness.   Hospitalist was called for admission for Dyspnea and hypertensive urgency.  Hospital Course:   Acute respiratory failure with hypoxia secondary to acute systolic and diastolic CHF / dyspnea / chest pain  Patient was admitted and placed on Lasix and nitroglycerin with subsequent stabilization of BP/respiratory status.  Troponins negative 3 sets. Continue aspirin therapy.  2-D echo done 09/11/14 which showed mildly reduced systolic function with an EF of 45-50 percent and grade 1 diastolic dysfunction.  Given elevated d-dimer, CT angiogram was done to rule out PE, which was negative.  Cardiology consult. Stress test abnormal, s/p cardiac catheterization   Hypertensive crisis/emergency  Initially treated with a nitroglycerin drip. Systolic blood pressures in the 150s.  Continue furosemide, Avapro, metoprolol.   Monitor and adjust BP medications for improved BP control.   New onset type 2 diabetes mellitus  Hemoglobin A1c 10%.  Will d/c on metformin-starting 11/25- not sure patient can handle lantus/SSI  Lipids okay with an LDL of 49.  Outpatient DM education set up. Will need insulin pens at discharge.   Hypokalemia   Secondary to diuretics. Continue replacement therapy.    Schizophrenia  Does not appear to be on any psychiatric medications. No evidence of psychosis.   Leukocytosis  No evidence of infection, resolved.   Hyponatremia  Likely secondary to CHF physiology.  Procedures:  Stress test  Cardiac cath  Consultations:  cardiology  Discharge Exam: Filed Vitals:   09/16/14 0451  BP: 157/100  Pulse: 83  Temp: 98.1 F (36.7 C)  Resp: 20    General: A+Ox3, nervous appearing Cardiovascular: rrr Respiratory: clear anterior  Discharge Instructions You were cared  for by a hospitalist during your hospital stay. If you have any questions about your discharge medications or the care you received while you were in the hospital after you are discharged, you can call the unit and asked to speak with the hospitalist on call if the  hospitalist that took care of you is not available. Once you are discharged, your primary care physician will handle any further medical issues. Please note that NO REFILLS for any discharge medications will be authorized once you are discharged, as it is imperative that you return to your primary care physician (or establish a relationship with a primary care physician if you do not have one) for your aftercare needs so that they can reassess your need for medications and monitor your lab values.  Discharge Instructions    Ambulatory referral to Nutrition and Diabetic Education    Complete by:  As directed      Diet - low sodium heart healthy    Complete by:  As directed      Diet Carb Modified    Complete by:  As directed      Discharge instructions    Complete by:  As directed   BMP 1 week     Increase activity slowly    Complete by:  As directed           Current Discharge Medication List    START taking these medications   Details  aspirin 81 MG chewable tablet Chew 1 tablet (81 mg total) by mouth daily. Qty: 30 tablet, Refills: 0    carvedilol (COREG) 12.5 MG tablet Take 1 tablet (12.5 mg total) by mouth 2 (two) times daily with a meal. Qty: 60 tablet, Refills: 0    furosemide (LASIX) 40 MG tablet Take 1 tablet (40 mg total) by mouth daily. Qty: 30 tablet, Refills: 0    irbesartan (AVAPRO) 300 MG tablet Take 1 tablet (300 mg total) by mouth daily. Qty: 30 tablet, Refills: 0    metFORMIN (GLUCOPHAGE) 500 MG tablet Take 1 tablet (500 mg total) by mouth 2 (two) times daily with a meal. Qty: 60 tablet, Refills: 0    potassium chloride (KLOR-CON M15) 15 MEQ tablet Take 2 tablets (30 mEq total) by mouth 2 (two) times daily. Qty: 60 tablet, Refills: 0      CONTINUE these medications which have NOT CHANGED   Details  acetaminophen (TYLENOL) 650 MG CR tablet Take 650 mg by mouth every 8 (eight) hours as needed for pain.    Multiple Vitamin (MULTIVITAMIN WITH MINERALS) TABS  tablet Take 1 tablet by mouth daily.      STOP taking these medications     aspirin 325 MG tablet      caffeine 200 MG TABS tablet        No Known Allergies Follow-up Information    Follow up with [facility].   Why:  Appt 09/30/2014  at 8:45 am   Contact information:   [facility] [phone], ext. [phone]        The results of significant diagnostics from this hospitalization (including imaging, microbiology, ancillary and laboratory) are listed below for reference.    Significant Diagnostic Studies: Dg Chest 2 View  09/10/2014   CLINICAL DATA:  Cough for 1 week. Shortness of breath at night when lying down.  EXAM: CHEST  2 VIEW  COMPARISON:  None.  FINDINGS: The  cardiac silhouette appears mildly enlarged. Mediastinal silhouette is nonsuspicious. Central pulmonary vascular congestion and interstitial prominence. Small pleural effusion, likely on the LEFT and seen only on lateral. Bibasilar strandy densities. Mildly elevated RIGHT hemidiaphragm. No pneumothorax. Soft tissue planes and included osseous structures unremarkable.  IMPRESSION: Mild cardiomegaly, interstitial prominence likely represents pulmonary edema with small pleural effusion. Bibasilar atelectasis versus confluent edema, less likely pneumonia.   Electronically Signed   By: Awilda Metroourtnay  Bloomer   On: 09/10/2014 21:38   Ct Angio Chest Pe W/cm &/or Wo Cm  09/11/2014   CLINICAL DATA:  Short of breath.  EXAM: CT ANGIOGRAPHY CHEST WITH CONTRAST  TECHNIQUE: Multidetector CT imaging of the chest was performed using the standard protocol during bolus administration of intravenous contrast. Multiplanar CT image reconstructions and MIPs were obtained to evaluate the vascular anatomy.  CONTRAST:  100mL OMNIPAQUE IOHEXOL 350 MG/ML SOLN  COMPARISON:  Radiograph 09/10/2014  FINDINGS: There are no filling defects  within the pulmonary arteries to suggest acute pulmonary embolism. No acute findings of the aorta or great vessels. Esophagus is normal.  Review of the lung parenchyma demonstrates bilateral pleural effusions and dense bibasilar passive atelectasis. Fine ground-glass opacity in the left upper lobe suggesting mild edema.  No axillary supraclavicular lymphadenopathy. No mediastinal hilar lymphadenopathy.  Limited upper abdomen is unremarkable. Limited view of the skeleton unremarkable.  Review of the MIP images confirms the above findings.  IMPRESSION: 1. No evidence acute pulmonary embolism. 2. Bilateral pleural effusions and dense bibasilar passive atelectasis   Electronically Signed   By: Genevive BiStewart  Edmunds M.D.   On: 09/11/2014 13:56   Nm Myocar Multi W/spect W/wall Motion / Ef  09/14/2014   CLINICAL DATA:  Respiratory failure, hypertensive crisis, CHF and cardiomyopathy.  EXAM: MYOCARDIAL IMAGING WITH SPECT (REST AND PHARMACOLOGIC-STRESS)  GATED LEFT VENTRICULAR WALL MOTION STUDY  LEFT VENTRICULAR EJECTION FRACTION  TECHNIQUE: Standard myocardial SPECT imaging was performed after resting intravenous injection of 10 mCi [id number] sestamibi. Subsequently, intravenous infusion of Lexiscan was performed under the supervision of the Cardiology staff. At peak effect of the drug, 30 mCi Tc-6056m sestamibi was injected intravenously and standard myocardial SPECT imaging was performed. Quantitative gated imaging was also performed to evaluate left ventricular wall motion, and estimate left ventricular ejection fraction.  COMPARISON:  None.  FINDINGS: Perfusion: There is evidence of inferior and inferolateral wall scar. Perfusion defect is medium in size. No evidence of inducible myocardial ischemia.  Wall Motion: The left ventricle is moderately dilated and shows global hypokinesis. Basal aspect of the septum shows akinesis/mild dyskinesis.  Left Ventricular Ejection Fraction: 36 %  End diastolic volume 193 ml  End systolic  volume 161123 ml  IMPRESSION: 1. Inferior and inferolateral scar without evidence of inducible myocardial ischemia.  2. Global hypokinesis with basal septal akinesis/ mild dyskinesis.  3. Left ventricular ejection fraction 36% with moderate LV cavity dilatation.  4.  Intermediate to high-risk stress test findings*.  *2012 Appropriate Use Criteria for Coronary Revascularization Focused Update: J Am Coll Cardiol. 2012;59(9):857-881. http://content.dementiazones.comonlinejacc.org/article.aspx?articleid=1201161   Electronically Signed   By: Irish LackGlenn  Yamagata M.D.   On: 09/14/2014 13:15    Microbiology: Recent Results (from the past 240 hour(s))  MRSA PCR Screening     Status: None   Collection Time: 09/11/14 12:07 AM  Result Value Ref Range Status   MRSA by PCR NEGATIVE NEGATIVE Final    Comment:        The GeneXpert MRSA Assay (FDA approved for NASAL specimens only), is one component  of a comprehensive MRSA colonization surveillance program. It is not intended to diagnose MRSA infection nor to guide or monitor treatment for MRSA infections.      Labs: Basic Metabolic Panel:  Recent Labs Lab 09/10/14 2048 09/11/14 0017 09/11/14 0620 09/12/14 0404 09/13/14 0500 09/14/14 0500  NA 134*  --  131* 135* 138 136*  K 3.8  --  3.2* 3.4* 3.6* 3.6*  CL 98  --  94* 96 100 97  CO2 20  --  24 26 26 24   GLUCOSE 251*  --  269* 194* 155* 140*  BUN 16  --  14 16 28* 25*  CREATININE 0.63 0.61 0.65 0.98 0.92 0.88  CALCIUM 9.1  --  8.7 9.1 9.3 9.5  MG  --   --  1.7  --   --   --   PHOS  --   --  2.8  --   --   --    Liver Function Tests:  Recent Labs Lab 09/11/14 0620  AST 32  ALT 68*  ALKPHOS 87  BILITOT 1.0  PROT 6.5  ALBUMIN 3.5   No results for input(s): LIPASE, AMYLASE in the last 168 hours. No results for input(s): AMMONIA in the last 168 hours. CBC:  Recent Labs Lab 09/10/14 2048 09/11/14 0017 09/11/14 0620  WBC 14.8* 12.3* 9.7  HGB 14.1 12.8* 12.2*  HCT 39.0 36.8* 35.2*  MCV 77.4* 77.8*  78.2  PLT 204 220 212   Cardiac Enzymes:  Recent Labs Lab 09/11/14 0017 09/11/14 0620 09/11/14 1217  TROPONINI <0.30 <0.30 <0.30   BNP: BNP (last 3 results)  Recent Labs  09/10/14 2047  PROBNP 957.1*   CBG:  Recent Labs Lab 09/15/14 0853 09/15/14 1216 09/15/14 1650 09/15/14 2221 09/16/14 0711  GLUCAP 131* 166* 117* 124* 138*       Signed:  Spenser Cong  [facility] 09/16/2014, 8:29 AM

## 2014-09-16 NOTE — Discharge Instructions (Signed)
Call [facility] at [phone] if any bleeding, swelling or drainage at cath site.  May shower, no tub baths for 48 hours for groin sticks. No driving for 3 days.  No lifting over 5 pounds for 3 days- this is to prevent any bleeding at the cath site.

## 2014-09-16 NOTE — Progress Notes (Signed)
     SUBJECTIVE: No chest pain or SOB  BP 157/100 mmHg  Pulse 83  Temp(Src) 98.1 F (36.7 C) (Oral)  Resp 20  Ht 5\' 9"  (1.753 m)  Wt 259 lb 0.7 oz (117.5 kg)  BMI 38.24 kg/m2  SpO2 98%  Intake/Output Summary (Last 24 hours) at 09/16/14 [id number] Last data filed at 09/15/14 2308  Gross per 24 hour  Intake   1350 ml  Output    950 ml  Net    400 ml    PHYSICAL EXAM General: Well developed, well nourished, in no acute distress. Alert and oriented x 3.  Psych:  Good affect, responds appropriately Neck: No JVD. No masses noted.  Lungs: Clear bilaterally with no wheezes or rhonci noted.  Heart: RRR with no murmurs noted. Abdomen: Bowel sounds are present. Soft, non-tender.  Extremities: No lower extremity edema.   LABS: Basic Metabolic Panel:  Recent Labs  84/69/6211/22/15 0500  NA 136*  K 3.6*  CL 97  CO2 24  GLUCOSE 140*  BUN 25*  CREATININE 0.88  CALCIUM 9.5    Current Meds: . aspirin  81 mg Oral Daily  . carvedilol  12.5 mg Oral BID WC  . docusate sodium  100 mg Oral BID  . insulin aspart  0-15 Units Subcutaneous TID WC  . insulin aspart  0-5 Units Subcutaneous QHS  . potassium chloride  30 mEq Oral BID   Cardiac cath 09/15/14: 1. Left main; normal  2. LAD; normal 3. Left circumflex; normal.  4. Right coronary artery; dominant and normal 5. Left ventriculography; RAO left ventriculogram was performed using  25 mL of Visipaque dye at 12 mL/second. The overall LVEF estimated  45-50 % With wall motion abnormalities  ASSESSMENT AND PLAN:  1. Hypertensive emergency: BP much improved from admission. Will resume ARB today.    2. Chest pain: High risk nuclear study but cardiac cath 09/15/14 with no angiographic evidence of CAD. LVEF 45-50% by LV gram.   3. Acute diastolic CHF: Volume status is much better. Will resume Lasix 40 mg po Qdaily.   He can be discharged from a cardiac standpoint. His f/u will be with Dr. Rennis GoldenHilty. We will arrange this f/u.     MCALHANY,CHRISTOPHER  11/24/20157:04 AM

## 2014-09-30 ENCOUNTER — Encounter: Payer: Medicare Other | Attending: Internal Medicine

## 2014-09-30 VITALS — Ht 69.0 in | Wt 263.3 lb

## 2014-09-30 DIAGNOSIS — E119 Type 2 diabetes mellitus without complications: Secondary | ICD-10-CM | POA: Diagnosis not present

## 2014-09-30 DIAGNOSIS — Z713 Dietary counseling and surveillance: Secondary | ICD-10-CM | POA: Diagnosis not present

## 2014-09-30 NOTE — Progress Notes (Signed)

## 2014-10-02 ENCOUNTER — Ambulatory Visit: Payer: Federal, State, Local not specified - PPO | Admitting: Internal Medicine

## 2014-10-02 ENCOUNTER — Encounter (HOSPITAL_COMMUNITY): Payer: Self-pay | Admitting: Cardiovascular Disease

## 2014-10-07 ENCOUNTER — Ambulatory Visit: Payer: Federal, State, Local not specified - PPO

## 2024-08-08 ENCOUNTER — Ambulatory Visit

## 2024-08-08 DIAGNOSIS — Z23 Encounter for immunization: Secondary | ICD-10-CM
